# Patient Record
Sex: Male | Born: 1945 | Race: White | Hispanic: No | Marital: Married | Smoking: Never smoker
Health system: Southern US, Community
[De-identification: ages and names within clinical notes are randomized; demographics above are authoritative.]

## PROBLEM LIST (undated history)

## (undated) DIAGNOSIS — I10 Essential (primary) hypertension: Secondary | ICD-10-CM

## (undated) DIAGNOSIS — E7800 Pure hypercholesterolemia, unspecified: Secondary | ICD-10-CM

## (undated) DIAGNOSIS — C61 Malignant neoplasm of prostate: Secondary | ICD-10-CM

## (undated) DIAGNOSIS — I499 Cardiac arrhythmia, unspecified: Secondary | ICD-10-CM

## (undated) DIAGNOSIS — I4891 Unspecified atrial fibrillation: Secondary | ICD-10-CM

## (undated) HISTORY — DX: Cardiac arrhythmia, unspecified: I49.9

## (undated) HISTORY — PX: HERNIA REPAIR: SHX51

## (undated) HISTORY — DX: Malignant neoplasm of prostate: C61

---

## 2008-07-27 ENCOUNTER — Ambulatory Visit (HOSPITAL_COMMUNITY): Admission: RE | Admit: 2008-07-27 | Discharge: 2008-07-27 | Payer: Self-pay | Admitting: Family Medicine

## 2008-08-01 ENCOUNTER — Encounter (HOSPITAL_COMMUNITY): Admission: RE | Admit: 2008-08-01 | Discharge: 2008-08-31 | Payer: Self-pay | Admitting: Family Medicine

## 2008-08-17 ENCOUNTER — Ambulatory Visit (HOSPITAL_COMMUNITY): Admission: RE | Admit: 2008-08-17 | Discharge: 2008-08-17 | Payer: Self-pay | Admitting: Family Medicine

## 2009-05-22 ENCOUNTER — Ambulatory Visit (HOSPITAL_COMMUNITY): Admission: RE | Admit: 2009-05-22 | Discharge: 2009-05-22 | Payer: Self-pay | Admitting: General Surgery

## 2009-06-25 ENCOUNTER — Ambulatory Visit (HOSPITAL_COMMUNITY): Admission: RE | Admit: 2009-06-25 | Discharge: 2009-06-25 | Payer: Self-pay | Admitting: General Surgery

## 2010-05-01 IMAGING — US US ABDOMEN COMPLETE
1 series · 14 of 25 positions shown · non-contrast
Comparison: None

CLINICAL DATA: Right upper quadrant pain

COMPLETE ABDOMINAL ULTRASOUND

[Series 1: unknown · 0.33mm/px · 14 of 72 slices shown]
[im 1/72]
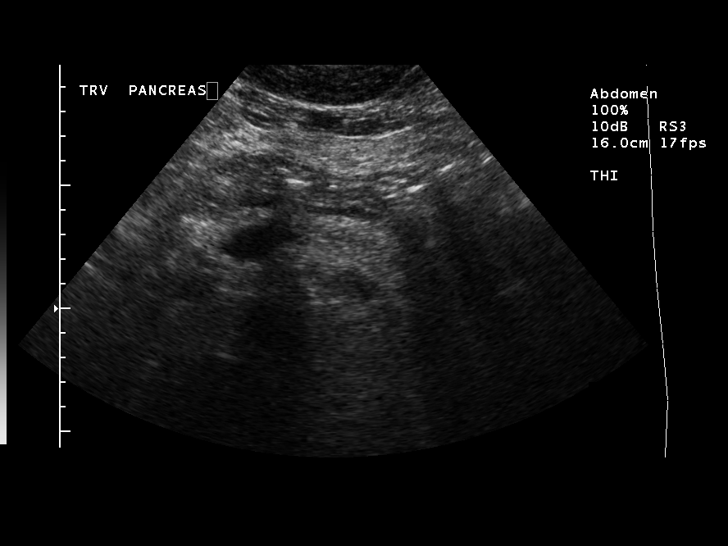
[im 6/72]
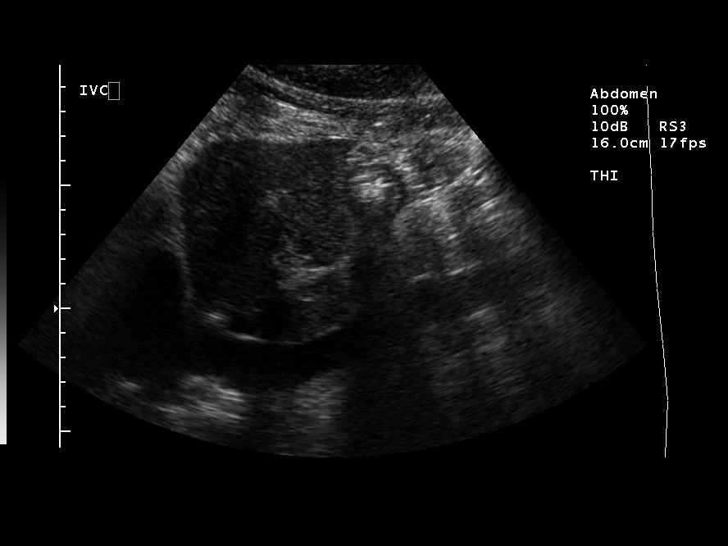
[im 12/72]
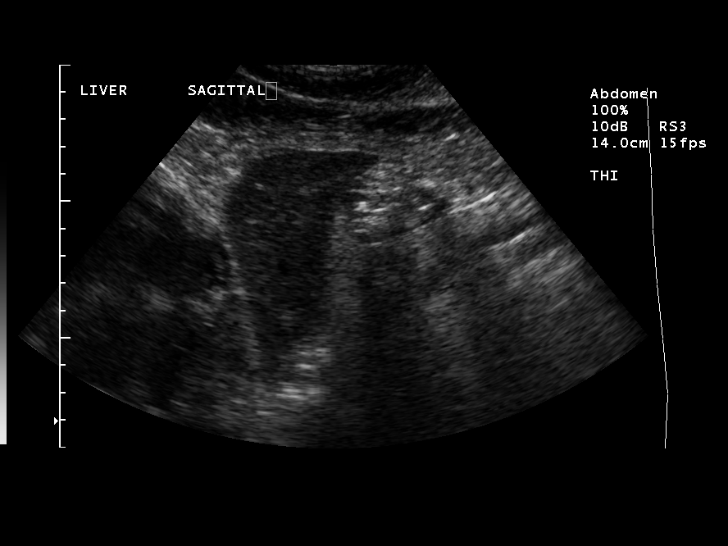
[im 18/72]
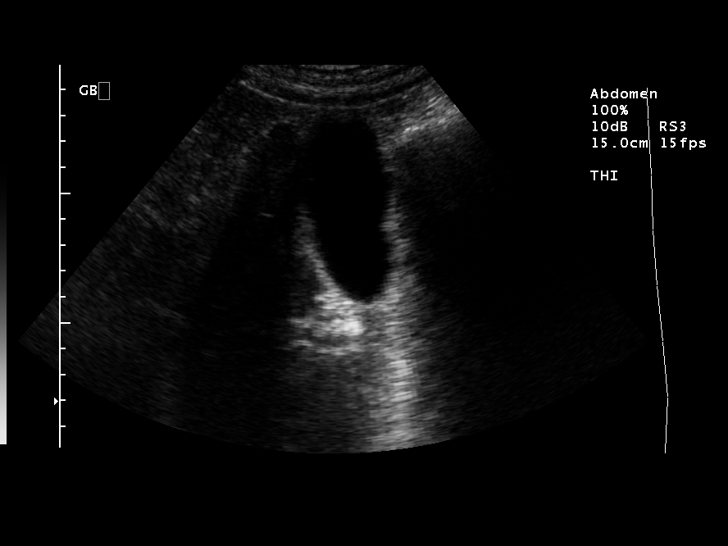
[im 24/72]
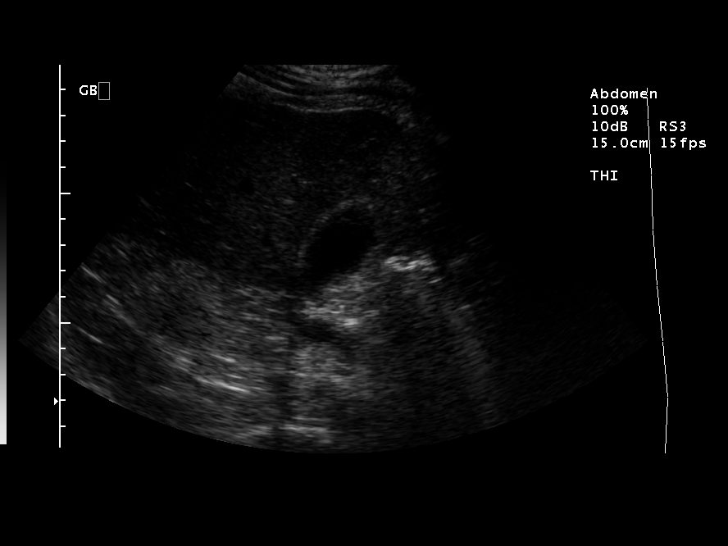
[im 27/72]
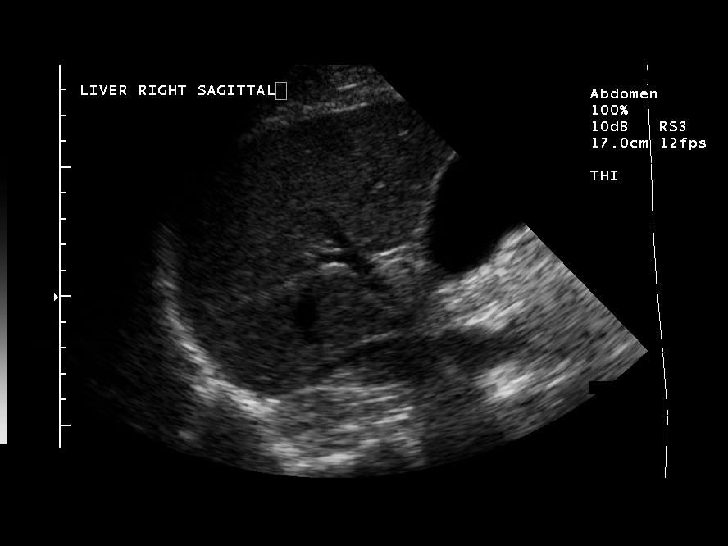
[im 33/72]
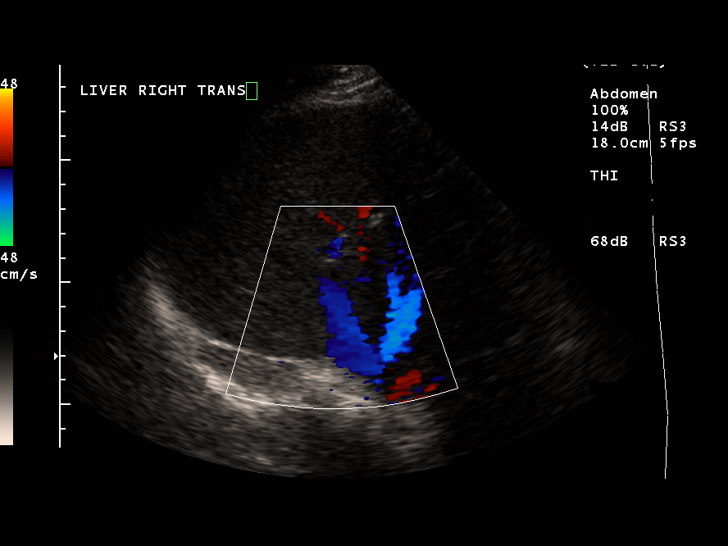
[im 39/72]
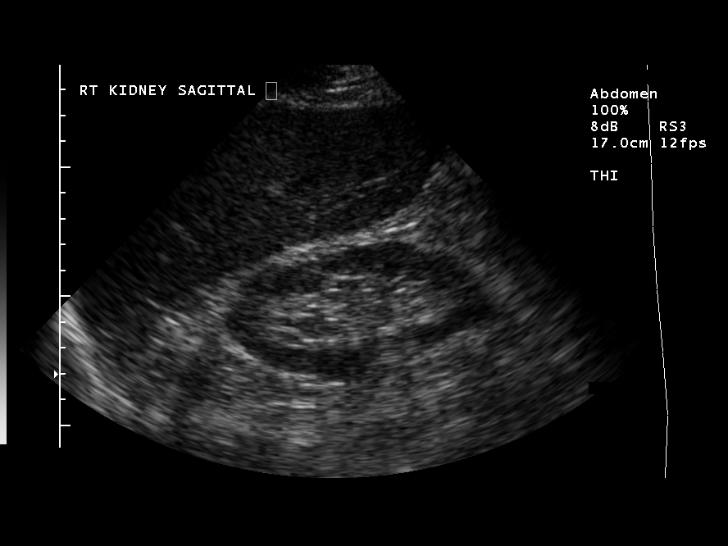
[im 45/72]
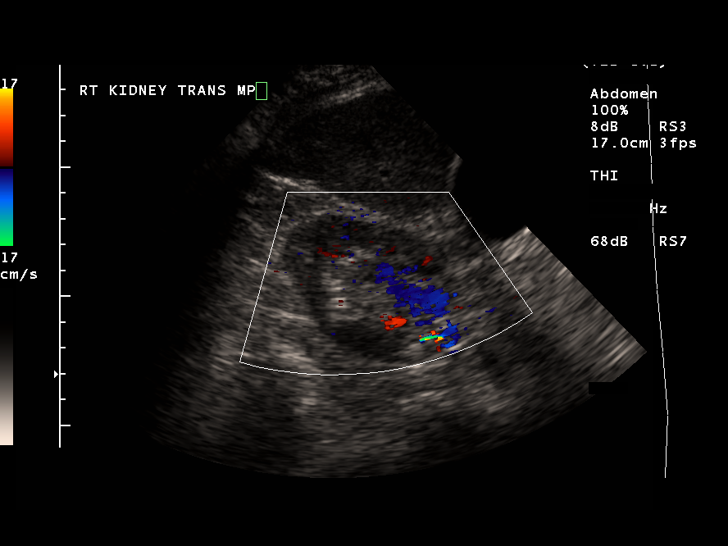
[im 48/72]
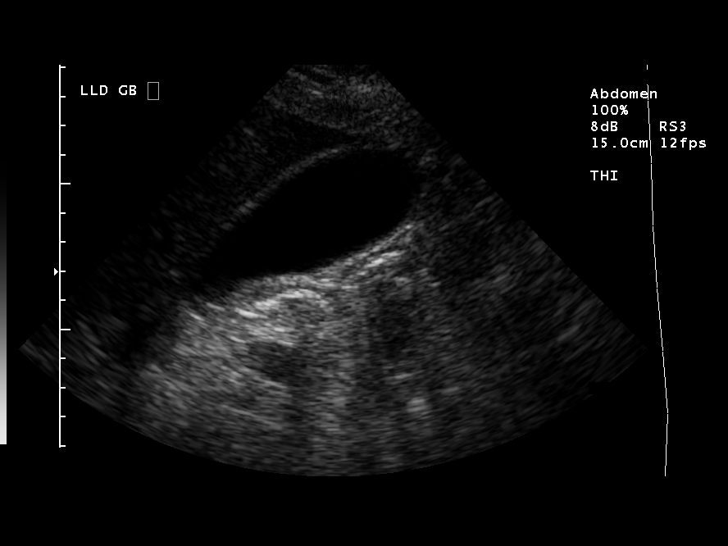
[im 54/72]
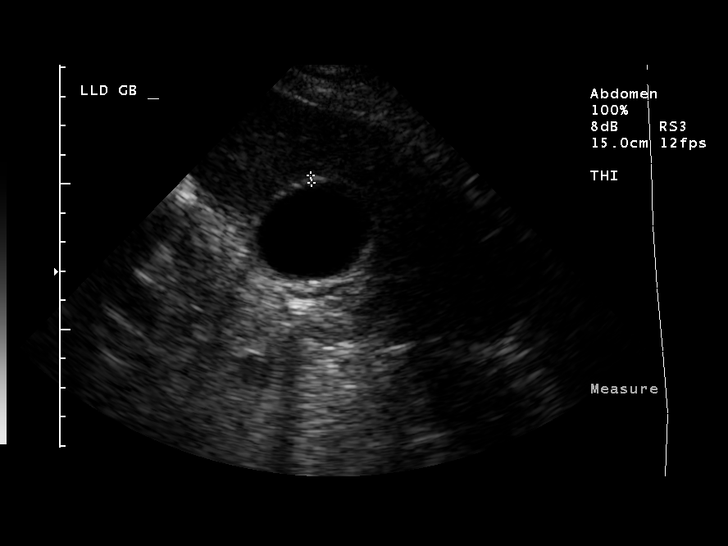
[im 60/72]
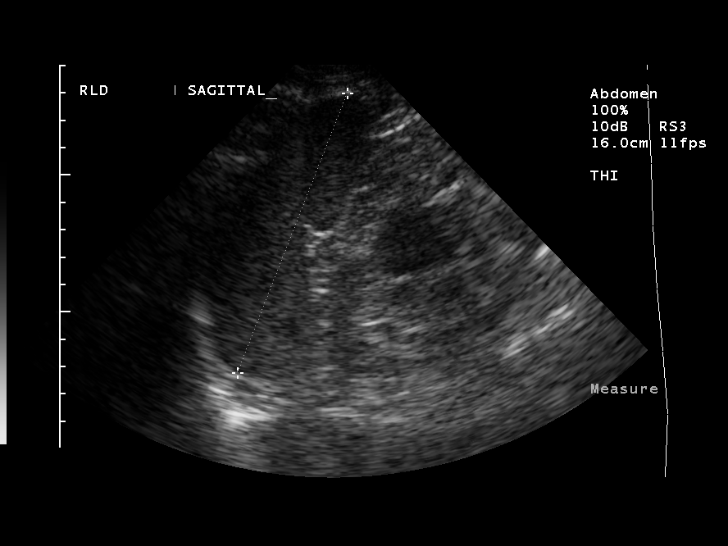
[im 66/72]
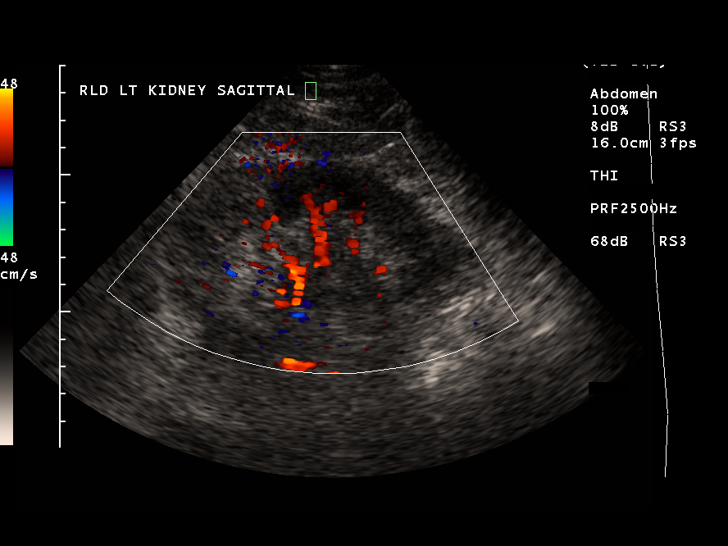
[im 72/72]
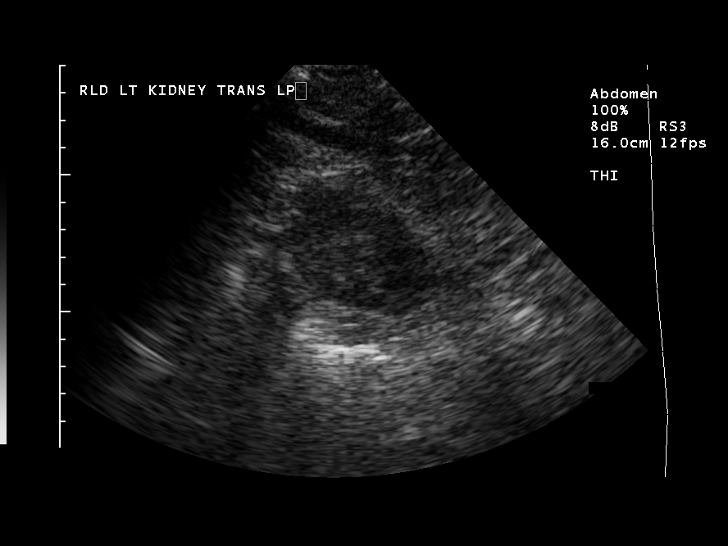

[14 of 25 positions shown; findings below may reference images not displayed]

FINDINGS: Gallbladder:  Normally distended without stones or wall thickening.
No sonographic Murphy's sign.

Common bile duct:  4.4 mm diameter, normal.

Liver:  Normal appearance.

IVC:  Unremarkable.

Pancreas:  Normal appearance.

Spleen:  Normal appearance, 10.9 cm length.

Right Kidney:  Normal size and morphology, 10.4 cm length.

Left Kidney:  Normal size and morphology, 11.4 cm length.

Abdominal aorta:  Normal caliber, unremarkable.

No free fluid.
IMPRESSION: Normal upper abdominal ultrasound.

## 2010-05-06 IMAGING — NM NM HEPATO W/GB/PHARM/[PERSON_NAME]
2 series · 12 of 12 positions shown · non-contrast
Comparison: Abdominal ultrasound 07/27/2008

CLINICAL DATA: Abdominal pain.

NUCLEAR MEDICINE HEPATOBILIARY IMAGING WITH GALLBLADDER EF
TECHNIQUE: Sequential images of the abdomen were obtained [DATE]
minutes following intravenous administration of
radiopharmaceutical.  After the slow intravenous infusion of
uCg Cholecystokinin, the gallbladder ejection fraction was
determined.
Radiopharmaceutical:  4.9 mCi 7c-FFm Choletec

[Series 1: hida · 3.20mm/px · 6 of 30 frames shown (1 of 2)]
[frame 3/30]
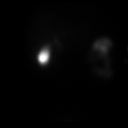
[frame 8/30]
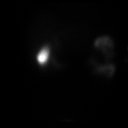
[frame 13/30]
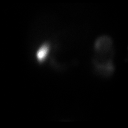
[frame 18/30]
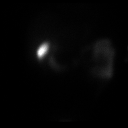
[frame 23/30]
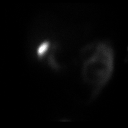
[frame 28/30]
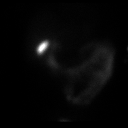

[Series 1: hida · 3.20mm/px · 6 of 60 frames shown (2 of 2)]
[frame 6/60]
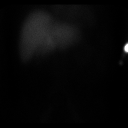
[frame 16/60]
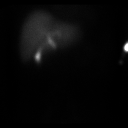
[frame 26/60]
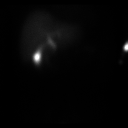
[frame 36/60]
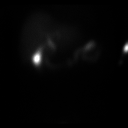
[frame 46/60]
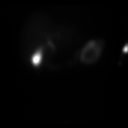
[frame 56/60]
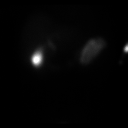

[12 of 12 positions shown; findings below may reference images not displayed]

FINDINGS: Initial images demonstrate homogenous hepatic activity
and prompt opacification of the gallbladder and biliary system.
There is spontaneous small bowel activity.

The stimulated portion of the study demonstrates good gallbladder
contraction and progressive small bowel activity.  The gallbladder
ejection fraction calculated at approximately 30 minutes is 51.2%.
Normal ejection fractions are considered greater than 35%.
IMPRESSION: Normal examination.  The cystic and common bile ducts are patent
and the gallbladder ejection fraction is normal at 51%.

## 2010-07-15 LAB — CBC
HCT: 51.8 % (ref 39.0–52.0)
Hemoglobin: 17.7 g/dL — ABNORMAL HIGH (ref 13.0–17.0)
WBC: 8.6 10*3/uL (ref 4.0–10.5)

## 2010-07-15 LAB — BASIC METABOLIC PANEL
Chloride: 100 mEq/L (ref 96–112)
GFR calc non Af Amer: 47 mL/min — ABNORMAL LOW (ref >60)
Potassium: 4.7 mEq/L (ref 3.5–5.1)
Sodium: 138 mEq/L (ref 135–145)

## 2012-01-03 ENCOUNTER — Emergency Department (HOSPITAL_COMMUNITY)
Admission: EM | Admit: 2012-01-03 | Discharge: 2012-01-03 | Disposition: A | Discharge status: Home / Self Care | Payer: Medicare Other | Attending: Emergency Medicine | Admitting: Emergency Medicine

## 2012-01-03 ENCOUNTER — Encounter (HOSPITAL_COMMUNITY): Payer: Self-pay | Admitting: Emergency Medicine

## 2012-01-03 DIAGNOSIS — X58XXXA Exposure to other specified factors, initial encounter: Secondary | ICD-10-CM | POA: Insufficient documentation

## 2012-01-03 DIAGNOSIS — I10 Essential (primary) hypertension: Secondary | ICD-10-CM | POA: Insufficient documentation

## 2012-01-03 DIAGNOSIS — T7840XA Allergy, unspecified, initial encounter: Secondary | ICD-10-CM

## 2012-01-03 DIAGNOSIS — T783XXA Angioneurotic edema, initial encounter: Secondary | ICD-10-CM

## 2012-01-03 DIAGNOSIS — I4891 Unspecified atrial fibrillation: Secondary | ICD-10-CM | POA: Insufficient documentation

## 2012-01-03 DIAGNOSIS — Z79899 Other long term (current) drug therapy: Secondary | ICD-10-CM | POA: Insufficient documentation

## 2012-01-03 DIAGNOSIS — Z7982 Long term (current) use of aspirin: Secondary | ICD-10-CM | POA: Insufficient documentation

## 2012-01-03 DIAGNOSIS — E7800 Pure hypercholesterolemia, unspecified: Secondary | ICD-10-CM | POA: Insufficient documentation

## 2012-01-03 HISTORY — DX: Pure hypercholesterolemia, unspecified: E78.00

## 2012-01-03 HISTORY — DX: Unspecified atrial fibrillation: I48.91

## 2012-01-03 HISTORY — DX: Essential (primary) hypertension: I10

## 2012-01-03 MED ORDER — PREDNISONE 20 MG PO TABS: ORAL_TABLET | ORAL | Status: DC

## 2012-01-03 MED ORDER — FAMOTIDINE IN NACL 20-0.9 MG/50ML-% IV SOLN
20.0000 mg | Freq: Once | INTRAVENOUS | Status: AC
  Administered 2012-01-03: 20 mg via INTRAVENOUS
  Filled 2012-01-03: qty 50

## 2012-01-03 MED ORDER — FAMOTIDINE 20 MG PO TABS: 20.0000 mg | ORAL_TABLET | Freq: Two times a day (BID) | ORAL | Status: DC

## 2012-01-03 MED ORDER — METHYLPREDNISOLONE SODIUM SUCC 125 MG IJ SOLR
125.0000 mg | Freq: Once | INTRAMUSCULAR | Status: AC
  Administered 2012-01-03: 125 mg via INTRAVENOUS
  Filled 2012-01-03: qty 2

## 2012-01-03 NOTE — ED Notes (Signed)
Pt reports that he woke up from his nap this afternoon and the left side of his mouth and lower lip was swelling. Upon assessment tongue and lower lip is swollen and enlarged. Pt denies any difficulty swallowing. Pt has a rash on his abdomen. Pt denies any new self care products or medications. Pt took two 25 mg benadryl prior to arrival. Pt airway intact, no active distress noted.

## 2012-01-03 NOTE — ED Notes (Signed)
Pt discharged. Pt stable at time of discharge. Medications reviewed pt has no questions regarding discharge at this time. Pt voiced understanding of discharge instructions.  

## 2012-01-03 NOTE — ED Notes (Signed)
Patient with swelling to right side of mouth and tongue. Denies shortness of breath, able to talk in complete sentences. IV initiated.

## 2012-01-03 NOTE — ED Provider Notes (Signed)
History   This chart was scribed for Angel Givens, MD scribed by Magnus Sinning. The patient was seen in room APA06/APA06 at 18:28  CSN: [id number]  Arrival date & time 01/03/12  1745     Chief Complaint  Patient presents with  . Oral Swelling    (Consider location/radiation/quality/duration/timing/severity/associated sxs/prior treatment) The history is provided by the patient. No language interpreter was used.  Angel Peters is a 66 y.o. male who presents to the Emergency Department complaining of constant moderate left sided oral swelling onset this afternoon approximately 2.5 hours ago. Patient says he woke up from a nap today and noticed his lower lip, and tongue were noticeably swollen. He says his wife gave him benadryl, but explains his tongue seemed to worsen. He states he then began having intermittent pruritis on his legs and arms.Patient denies hx of similar sxs and explains when he arrived the nursing staff noticed a rash on his abd. Pt says he has not started any new medications within the last the last two months, eaten anything new today, or applied any or used any new creams or soaps. Pt's wife does note that they went hiking this morning and he suspect a possible insect bite. Patient has a hx of A-fib, which he treats with a blood thinner.   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  Cardiologist: Dr. Rennis Golden PCP: Dr. Juanetta Gosling   Past Medical History  Diagnosis Date  . Hypertension   . A-fib   . High cholesterol     Past Surgical History  Procedure Date  . Hernia repair     History reviewed. No pertinent family history.  History  Substance Use Topics  . Smoking status: Never Smoker   . Smokeless tobacco: Not on file  . Alcohol Use: Yes     socially  retired  Review of Systems 10 Systems reviewed and are negative for acute change except as noted in the HPI. Allergies  Review of patient's allergies indicates no known allergies.  Home  Medications   Current Outpatient Rx  Name Route Sig Dispense Refill  . AMLODIPINE BESYLATE 10 MG PO TABS Oral Take 10 mg by mouth daily.    . APIXABAN 5 MG PO TABS Oral Take 5 mg by mouth 2 (two) times daily.    . ASPIRIN EC 81 MG PO TBEC Oral Take 81 mg by mouth daily.    Angel Peters HYDRALAZINE HCL 25 MG PO TABS Oral Take 25 mg by mouth 3 (three) times daily.    Angel Peters METOPROLOL SUCCINATE ER 100 MG PO TB24 Oral Take 150 mg by mouth daily. Take with or immediately following a meal.    . ONE-DAILY MULTI VITAMINS PO TABS Oral Take 1 tablet by mouth daily.    Angel Peters ROSUVASTATIN CALCIUM 20 MG PO TABS Oral Take 20 mg by mouth daily.      BP 149/94  Pulse 80  Temp 97.5 F (36.4 C) (Oral)  Resp 18  Ht 5\' 9"  (1.753 m)  Wt 180 lb (81.647 kg)  BMI 26.58 kg/m2  SpO2 97%  Vital signs normal    Physical Exam  Nursing note and vitals reviewed. Constitutional: He is oriented to person, place, and time. He appears well-developed and well-nourished. No distress.  HENT:  Head: Normocephalic and atraumatic.       Tongue is mildly thick and lower lip swelling noted  Eyes: EOM are normal. Pupils are equal, round, and reactive to light.  Neck: Neck supple. No tracheal deviation present.  Cardiovascular:       Irregular rate  Pulmonary/Chest: Effort normal. No respiratory distress.  Abdominal: Soft. He exhibits no distension.  Musculoskeletal: Normal range of motion. He exhibits no edema.  Neurological: He is alert and oriented to person, place, and time. No sensory deficit.  Skin: Skin is warm and dry. Rash noted.       Small scattered red lesions on lower extremities.  Excoriations around umbilicus  Psychiatric: He has a normal mood and affect. His behavior is normal.    ED Course  Procedures (including critical care time) DIAGNOSTIC STUDIES: Oxygen Saturation is 97% on room air, normal by my interpretation.    COORDINATION OF CARE: 18:36: Informed patient to monitor with IV and medications. Patient  and family agreeable.  19:47: Performed recheck. Patient notes improvement of tongue and lips. Provided intent to d/c and recommended continued monitor and benadryl if sxs persists.  Will d/c home with prednisone and pepcid, which he will need to finish.   Medications administered in the ED  Medications  famotidine (PEPCID) IVPB 20 mg (0 mg Intravenous Stopped 01/03/12 1950)  methylPREDNISolone sodium succinate (SOLU-MEDROL) 125 mg/2 mL injection 125 mg (125 mg Intravenous Given 01/03/12 1847)       1. Allergic reaction   2. Angioedema of lips    New Prescriptions   FAMOTIDINE (PEPCID) 20 MG TABLET    Take 1 tablet (20 mg total) by mouth 2 (two) times daily.   PREDNISONE (DELTASONE) 20 MG TABLET    Take 3 po QD x 2d starting tomorrow, then 2 po QD x 3d then 1 po QD x 3d   Plan discharge  Angel Albe, MD, FACEP    MDM   I personally performed the services described in this documentation, which was scribed in my presence. The recorded information has been reviewed and considered. Angel Albe, MD, Armando Gang        Athen Givens, MD 01/12/12 1249

## 2012-08-26 ENCOUNTER — Encounter: Payer: Self-pay | Admitting: Internal Medicine

## 2012-09-02 ENCOUNTER — Other Ambulatory Visit: Payer: Self-pay | Admitting: *Deleted

## 2012-09-02 MED ORDER — ROSUVASTATIN CALCIUM 20 MG PO TABS: 20.0000 mg | ORAL_TABLET | Freq: Every day | ORAL | Status: DC

## 2012-09-03 ENCOUNTER — Encounter: Payer: Self-pay | Admitting: Internal Medicine

## 2012-09-03 ENCOUNTER — Ambulatory Visit (INDEPENDENT_AMBULATORY_CARE_PROVIDER_SITE_OTHER): Payer: Medicare Other | Admitting: Internal Medicine

## 2012-09-03 VITALS — BP 110/70 | HR 70 | Ht 69.0 in | Wt 196.0 lb

## 2012-09-03 DIAGNOSIS — I4891 Unspecified atrial fibrillation: Secondary | ICD-10-CM

## 2012-09-03 DIAGNOSIS — I10 Essential (primary) hypertension: Secondary | ICD-10-CM

## 2012-09-03 DIAGNOSIS — E785 Hyperlipidemia, unspecified: Secondary | ICD-10-CM

## 2012-09-03 DIAGNOSIS — E782 Mixed hyperlipidemia: Secondary | ICD-10-CM

## 2012-09-03 DIAGNOSIS — I482 Chronic atrial fibrillation, unspecified: Secondary | ICD-10-CM

## 2012-09-03 NOTE — Progress Notes (Signed)
[facility]          OFFICE NOTE   Chief Complaint:  Annual visit  Primary Care Physician: Angel Maudlin, MD  HPI:  Angel Peters is a 67 year old gentleman with permanent atrial fibrillation, moderate MR, mildly reduced LV function and history of hypertension and dyslipidemia. He is actually doing fairly well during his retirement. He has managed to lose some more weight. Blood pressure has been well controlled and he exercises fairly regularly. He recently had abnormal cholesterol profile with an LDL of 150 and I recommended increasing his simvastatin to Crestor 20 mg daily. He has tolerated that well with a marked improvement of his total cholesterol. Of course, repeat cholesterol testing in your office now shows a total cholesterol of 130 with LDL of 73, which is good overall control. In addition, he does have chronic kidney disease with a creatinine of 1.8 in March; however, improved to 1.4 in May. Overall, he has no significant new symptoms such as worsening chest pain, shortness of breath, palpitations, presyncope or syncopal symptoms. He continues in chronic atrial fibrillation on Eliquis without obvious bleeding events.  He also takes low dose aspirin.  PMHx:  Past Medical History  Diagnosis Date  . Hypertension   . A-fib     Moderate MR  2D Echo showed EF of 45-50% on 05/08/2009  . High cholesterol     Past Surgical History  Procedure Laterality Date  . Hernia repair      FAMHx:  Family History  Problem Relation Age of Onset  . Heart disease      SOCHx:   reports that he has never smoked. He does not have any smokeless tobacco history on file. He reports that  drinks alcohol. He reports that he does not use illicit drugs.  ALLERGIES:  No Known Allergies  ROS: A comprehensive review of systems was negative except for: Cardiovascular: positive for irregular heart beat  HOME MEDS: Current Outpatient Prescriptions  Medication Sig  Dispense Refill  . amLODipine (NORVASC) 10 MG tablet Take 10 mg by mouth daily.      Marland Kitchen apixaban (ELIQUIS) 5 MG TABS tablet Take 5 mg by mouth 2 (two) times daily.      Marland Kitchen aspirin EC 81 MG tablet Take 81 mg by mouth daily.      . hydrALAZINE (APRESOLINE) 25 MG tablet Take 25 mg by mouth 3 (three) times daily.      . metoprolol succinate (TOPROL-XL) 100 MG 24 hr tablet Take 150 mg by mouth daily. Take with or immediately following a meal.      . Multiple Vitamin (MULTIVITAMIN) tablet Take 1 tablet by mouth daily.      . rosuvastatin (CRESTOR) 20 MG tablet Take 1 tablet (20 mg total) by mouth daily.  90 tablet  3  . famotidine (PEPCID) 20 MG tablet Take 1 tablet (20 mg total) by mouth 2 (two) times daily.  18 tablet  0  . predniSONE (DELTASONE) 20 MG tablet Take 3 po QD x 2d starting tomorrow, then 2 po QD x 3d then 1 po QD x 3d  15 tablet  0   No current facility-administered medications for this visit.    LABS/IMAGING: No results found for this or any previous visit (from the past 48 hour(s)). No results found.  VITALS: BP 110/70  Pulse 70  Ht 5\' 9"  (1.753 m)  Wt 196 lb (88.905 kg)  BMI 28.93 kg/m2  EXAM: General appearance: alert and  no distress Neck: no adenopathy, no carotid bruit, no JVD, supple, symmetrical, trachea midline and thyroid not enlarged, symmetric, no tenderness/mass/nodules Lungs: clear to auscultation bilaterally Heart: irregularly irregular rhythm Abdomen: soft, non-tender; bowel sounds normal; no masses,  no organomegaly Extremities: extremities normal, atraumatic, no cyanosis or edema Pulses: 2+ and symmetric Skin: Skin color, texture, turgor normal. No rashes or lesions Neurologic: Grossly normal  EKG: Atrial fibrillation at 70  ASSESSMENT: 1. Chronic atrial fibrillation 2. Hypertension - at goal 3. Hyperlipidemia 4. CKD II  PLAN: 1.   Angel Peters is doing well. He has had no adverse bleeding with Eliquis. He continues in rate controlled a-fib which  is chronic, if not permanent. He is unaware of it. A recent renal panel showed a creatinine of 1.52, which is about baseline. He is in need of a new lipid profile, as it has been about 1 year since it was last checked. We'll do that today and follow-up with him annually or sooner if needed.  Angel Nose, MD, [facility] Attending Cardiologist [facility]  Angel Peters 09/03/2012, 6:38 PM

## 2012-09-03 NOTE — Patient Instructions (Addendum)
Your physician recommends that you schedule a follow-up appointment in 12 months May 2015.  Your physician recommends that you continue on your current medications as directed. Please refer to the Current Medication list given to you today.   Your physician recommends that you return for a FASTING lipid profile

## 2012-09-06 LAB — LIPID PANEL
HDL: 44 mg/dL (ref >39)
LDL Cholesterol: 99 mg/dL (ref 0–99)
Total CHOL/HDL Ratio: 3.8 Ratio
VLDL: 24 mg/dL (ref 0–40)

## 2012-09-15 ENCOUNTER — Telehealth: Payer: Self-pay | Admitting: *Deleted

## 2012-09-15 NOTE — Telephone Encounter (Signed)
LM w/lab results, continue current meds

## 2012-10-01 ENCOUNTER — Other Ambulatory Visit: Payer: Self-pay | Admitting: *Deleted

## 2012-10-01 MED ORDER — APIXABAN 5 MG PO TABS: 5.0000 mg | ORAL_TABLET | Freq: Two times a day (BID) | ORAL | Status: DC

## 2013-02-07 ENCOUNTER — Other Ambulatory Visit: Payer: Self-pay | Admitting: *Deleted

## 2013-02-07 MED ORDER — METOPROLOL SUCCINATE ER 100 MG PO TB24: ORAL_TABLET | ORAL | Status: DC

## 2013-08-01 ENCOUNTER — Other Ambulatory Visit: Payer: Self-pay

## 2013-08-01 MED ORDER — METOPROLOL SUCCINATE ER 100 MG PO TB24: ORAL_TABLET | ORAL | Status: DC

## 2013-08-01 MED ORDER — APIXABAN 5 MG PO TABS: 5.0000 mg | ORAL_TABLET | Freq: Two times a day (BID) | ORAL | Status: DC

## 2013-08-01 MED ORDER — ROSUVASTATIN CALCIUM 20 MG PO TABS: 20.0000 mg | ORAL_TABLET | Freq: Every day | ORAL | Status: DC

## 2013-08-01 NOTE — Telephone Encounter (Signed)
Rx was sent to pharmacy electronically. 

## 2013-08-22 ENCOUNTER — Other Ambulatory Visit: Payer: Self-pay | Admitting: *Deleted

## 2013-08-22 MED ORDER — ROSUVASTATIN CALCIUM 20 MG PO TABS: 20.0000 mg | ORAL_TABLET | Freq: Every day | ORAL | Status: DC

## 2013-09-05 ENCOUNTER — Encounter: Payer: Self-pay | Admitting: Internal Medicine

## 2013-09-05 ENCOUNTER — Ambulatory Visit (INDEPENDENT_AMBULATORY_CARE_PROVIDER_SITE_OTHER): Payer: Medicare Other | Admitting: Internal Medicine

## 2013-09-05 VITALS — BP 142/84 | HR 81 | Ht 69.0 in | Wt 194.3 lb

## 2013-09-05 DIAGNOSIS — R600 Localized edema: Secondary | ICD-10-CM

## 2013-09-05 DIAGNOSIS — R609 Edema, unspecified: Secondary | ICD-10-CM

## 2013-09-05 DIAGNOSIS — I482 Chronic atrial fibrillation, unspecified: Secondary | ICD-10-CM

## 2013-09-05 DIAGNOSIS — I4891 Unspecified atrial fibrillation: Secondary | ICD-10-CM

## 2013-09-05 DIAGNOSIS — E785 Hyperlipidemia, unspecified: Secondary | ICD-10-CM

## 2013-09-05 DIAGNOSIS — I10 Essential (primary) hypertension: Secondary | ICD-10-CM

## 2013-09-05 DIAGNOSIS — I340 Nonrheumatic mitral (valve) insufficiency: Secondary | ICD-10-CM

## 2013-09-05 DIAGNOSIS — I428 Other cardiomyopathies: Secondary | ICD-10-CM

## 2013-09-05 DIAGNOSIS — I059 Rheumatic mitral valve disease, unspecified: Secondary | ICD-10-CM

## 2013-09-05 NOTE — Progress Notes (Signed)
Chief Complaint:  Annual visit  Primary Care Physician: Fredirick Maudlin, MD  HPI:  Angel Peters is a 68 year old gentleman with permanent atrial fibrillation, moderate MR, mildly reduced LV function and history of hypertension and dyslipidemia. He is actually doing fairly well during his retirement. He has managed to lose some more weight. Blood pressure has been well controlled and he exercises fairly regularly. He recently had abnormal cholesterol profile with an LDL of 150 and I recommended increasing his simvastatin to Crestor 20 mg daily. He has tolerated that well with a marked improvement of his total cholesterol. Of course, repeat cholesterol testing in your office now shows a total cholesterol of 130 with LDL of 73, which is good overall control. In addition, he does have chronic kidney disease with a creatinine of 1.8 in March; however, improved to 1.4 in May. Overall, he has no significant new symptoms such as worsening chest pain, shortness of breath, palpitations, presyncope or syncopal symptoms. He continues in chronic atrial fibrillation on Eliquis without obvious bleeding events.  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 This has not been reassessed since that time. It is notable that he has 1+ bilateral lower extremity swelling but denies any shortness of breath. He has a known mitral murmur.  PMHx:  Past Medical History  Diagnosis Date  . Hypertension   . A-fib     Moderate MR  2D Echo showed EF of 45-50% on 05/08/2009  . High cholesterol     Past Surgical History  Procedure Laterality Date  . Hernia repair      FAMHx:  Family History  Problem Relation Age of Onset  . Heart disease      SOCHx:   reports that he has never smoked. He does not have any smokeless tobacco history on file. He reports that he drinks alcohol. He reports that he does not use illicit drugs.  ALLERGIES:  No Known  Allergies  ROS: A comprehensive review of systems was negative except for: Cardiovascular: positive for irregular heart beat and lower extremity edema  HOME MEDS: Current Outpatient Prescriptions  Medication Sig Dispense Refill  . amLODipine (NORVASC) 10 MG tablet Take 10 mg by mouth daily.      Marland Kitchen apixaban (ELIQUIS) 5 MG TABS tablet Take 1 tablet (5 mg total) by mouth 2 (two) times daily.  180 tablet  0  . hydrALAZINE (APRESOLINE) 25 MG tablet Take 25 mg by mouth 3 (three) times daily.      . metoprolol succinate (TOPROL-XL) 100 MG 24 hr tablet Take 1.5 tablets (150 mg total) by mouth once daily. Take with or immediately following a meal.  135 tablet  0  . Multiple Vitamin (MULTIVITAMIN) tablet Take 1 tablet by mouth daily.      . naproxen sodium (ANAPROX) 220 MG tablet Take 220 mg by mouth daily.      . rosuvastatin (CRESTOR) 20 MG tablet Take 1 tablet (20 mg total) by mouth daily.  90 tablet  1   No current facility-administered medications for this visit.    LABS/IMAGING: No results found for this or any previous visit (from the past 48 hour(s)). No results found.  VITALS: BP 142/84  Pulse 81  Ht 5\' 9"  (1.753 m)  Wt 194 lb 4.8 oz (88.134 kg)  BMI 28.68 kg/m2  EXAM: General appearance: alert and no distress Neck: no adenopathy, no carotid bruit, no JVD, supple, symmetrical, trachea  midline and thyroid not enlarged, symmetric, no tenderness/mass/nodules Lungs: clear to auscultation bilaterally Heart: irregularly irregular rhythm Abdomen: soft, non-tender; bowel sounds normal; no masses,  no organomegaly Extremities: edema 1+ bilateral LE pitting edema Pulses: 2+ and symmetric Skin: Skin color, texture, turgor normal. No rashes or lesions Neurologic: Grossly normal  EKG: Atrial fibrillation at 81  ASSESSMENT: 1. Chronic atrial fibrillation 2. Hypertension - at goal 3. 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 Moderate MR, mild TR,  mild AI  PLAN: 1.   Angel Peters is doing well. He has had no adverse bleeding with Eliquis. He continues in rate controlled a-fib which is chronic, if not permanent. He is unaware of it. A recent renal panel showed a creatinine of 1.52, which is about baseline. He does not get short of breath with activities. He does have significant LE edema - which may be due to norvasc, however, I would like to re-assess his LV function. Recommend a repeat echocardiogram.  He will get labs through his PCP next week. Plan follow-up annually.  Chrystie NoseKenneth C. Zailyn Rowser, MD, [facility] Attending Cardiologist [facility]  Chrystie NoseKenneth C. Anaeli Cornwall 09/05/2013, 9:17 AM

## 2013-09-05 NOTE — Patient Instructions (Addendum)
Please have your primary doctor sent your lab work to Dr. Blanchie Dessert office (fax [phone]). Thank you.   Your physician has requested that you have an echocardiogram. Echocardiography is a painless test that uses sound waves to create images of your heart. It provides your doctor with information about the size and shape of your heart and how well your heart's chambers and valves are working. This procedure takes approximately one hour. There are no restrictions for this procedure.  Your physician wants you to follow-up in: 1 year. You will receive a reminder letter in the mail two months in advance. If you don't receive a letter, please call our office to schedule the follow-up appointment.

## 2013-09-06 ENCOUNTER — Ambulatory Visit (HOSPITAL_COMMUNITY)
Admission: RE | Admit: 2013-09-06 | Discharge: 2013-09-06 | Disposition: A | Discharge status: Home / Self Care | Payer: Medicare Other | Source: Ambulatory Visit | Attending: Internal Medicine | Admitting: Internal Medicine

## 2013-09-06 DIAGNOSIS — I059 Rheumatic mitral valve disease, unspecified: Secondary | ICD-10-CM | POA: Insufficient documentation

## 2013-09-06 DIAGNOSIS — I10 Essential (primary) hypertension: Secondary | ICD-10-CM | POA: Insufficient documentation

## 2013-09-06 DIAGNOSIS — I340 Nonrheumatic mitral (valve) insufficiency: Secondary | ICD-10-CM

## 2013-09-06 DIAGNOSIS — R600 Localized edema: Secondary | ICD-10-CM

## 2013-09-06 DIAGNOSIS — I369 Nonrheumatic tricuspid valve disorder, unspecified: Secondary | ICD-10-CM

## 2013-09-06 DIAGNOSIS — I482 Chronic atrial fibrillation, unspecified: Secondary | ICD-10-CM

## 2013-09-06 DIAGNOSIS — I4891 Unspecified atrial fibrillation: Secondary | ICD-10-CM

## 2013-09-06 DIAGNOSIS — E785 Hyperlipidemia, unspecified: Secondary | ICD-10-CM | POA: Insufficient documentation

## 2013-09-06 DIAGNOSIS — I428 Other cardiomyopathies: Secondary | ICD-10-CM

## 2013-09-06 NOTE — Progress Notes (Signed)
  Echocardiogram 2D Echocardiogram has been performed.  Zalea Pete L Sonya Gunnoe 09/06/2013, 11:03 AM

## 2013-09-26 ENCOUNTER — Other Ambulatory Visit: Payer: Self-pay | Admitting: *Deleted

## 2013-09-26 MED ORDER — METOPROLOL SUCCINATE ER 100 MG PO TB24: ORAL_TABLET | ORAL | Status: DC

## 2013-09-26 NOTE — Telephone Encounter (Signed)
Rx was sent to pharmacy electronically. 

## 2013-12-23 ENCOUNTER — Other Ambulatory Visit: Payer: Self-pay | Admitting: Pharmacist Clinician (PhC)/ Clinical Pharmacy Specialist

## 2013-12-23 MED ORDER — APIXABAN 5 MG PO TABS: 5.0000 mg | ORAL_TABLET | Freq: Two times a day (BID) | ORAL | Status: DC

## 2014-02-14 ENCOUNTER — Other Ambulatory Visit: Payer: Self-pay

## 2014-02-14 ENCOUNTER — Other Ambulatory Visit: Payer: Self-pay | Admitting: *Deleted

## 2014-02-14 MED ORDER — ROSUVASTATIN CALCIUM 20 MG PO TABS: 20.0000 mg | ORAL_TABLET | Freq: Every day | ORAL | Status: DC

## 2014-02-14 NOTE — Telephone Encounter (Signed)
Refilled electronically 

## 2014-02-14 NOTE — Telephone Encounter (Signed)
Rx was sent to pharmacy electronically. 

## 2014-05-24 ENCOUNTER — Other Ambulatory Visit: Payer: Self-pay

## 2014-05-24 MED ORDER — METOPROLOL SUCCINATE ER 100 MG PO TB24: ORAL_TABLET | ORAL | Status: DC

## 2014-05-24 NOTE — Telephone Encounter (Signed)
Rx sent to pharmacy   

## 2014-06-26 ENCOUNTER — Other Ambulatory Visit: Payer: Self-pay | Admitting: *Deleted

## 2014-06-26 MED ORDER — APIXABAN 5 MG PO TABS: 5.0000 mg | ORAL_TABLET | Freq: Two times a day (BID) | ORAL | Status: DC

## 2014-09-27 ENCOUNTER — Other Ambulatory Visit: Payer: Self-pay

## 2014-09-27 NOTE — Telephone Encounter (Signed)
Rx(s) sent to pharmacy electronically.  

## 2014-09-28 MED ORDER — APIXABAN 5 MG PO TABS: ORAL_TABLET | ORAL | Status: AC

## 2014-09-29 ENCOUNTER — Telehealth: Payer: Self-pay | Admitting: Internal Medicine

## 2014-09-29 NOTE — Telephone Encounter (Signed)
Advised [facility] to fill Eliquis for patient.

## 2014-10-16 ENCOUNTER — Other Ambulatory Visit: Payer: Self-pay

## 2021-04-21 HISTORY — PX: CATARACT EXTRACTION: SUR2

## 2022-07-07 ENCOUNTER — Ambulatory Visit (INDEPENDENT_AMBULATORY_CARE_PROVIDER_SITE_OTHER): Payer: Medicare HMO | Admitting: Internal Medicine

## 2022-07-07 ENCOUNTER — Encounter: Payer: Self-pay | Admitting: Internal Medicine

## 2022-07-07 VITALS — BP 137/82 | HR 67 | Resp 16 | Ht 69.0 in | Wt 152.0 lb

## 2022-07-07 DIAGNOSIS — I4811 Longstanding persistent atrial fibrillation: Secondary | ICD-10-CM

## 2022-07-07 DIAGNOSIS — I10 Essential (primary) hypertension: Secondary | ICD-10-CM

## 2022-07-07 DIAGNOSIS — E538 Deficiency of other specified B group vitamins: Secondary | ICD-10-CM | POA: Diagnosis not present

## 2022-07-07 DIAGNOSIS — R897 Abnormal histological findings in specimens from other organs, systems and tissues: Secondary | ICD-10-CM | POA: Insufficient documentation

## 2022-07-07 DIAGNOSIS — E785 Hyperlipidemia, unspecified: Secondary | ICD-10-CM | POA: Diagnosis not present

## 2022-07-07 DIAGNOSIS — I482 Chronic atrial fibrillation, unspecified: Secondary | ICD-10-CM

## 2022-07-07 NOTE — Assessment & Plan Note (Signed)
BP 137/82 today.  Currently on amlodipine 5 mg, hydralazine 75 mg 3 times daily, Toprol 100 mg daily.  Previously on amlodipine 10 mg and had swelling in his legs.  Tolerates taking hydralazine 3 times daily.  Discussed other options for once a day blood pressure medications with patient , but he has been controlled on this regimen for some time. No changes made today.

## 2022-07-07 NOTE — Assessment & Plan Note (Signed)
Currently in Atrial fibrillation, rate controlled. Continue metoprolol succinate 100 mg daily.  Continue Eliquis 5 mg daily. Referral placed for cardiology for patient to establish care as requested.

## 2022-07-07 NOTE — Assessment & Plan Note (Addendum)
History of hyperlipidemia on rosuvastatin 20 mg.  Patient will follow-up for fasting labs, check lipid panel

## 2022-07-07 NOTE — Assessment & Plan Note (Signed)
On B12 supplement for history of B12 deficiency Check B12

## 2022-07-07 NOTE — Assessment & Plan Note (Signed)
History of abnormal Prostate MRI and had prostate biopsy 2 years ago.  Reports low risk biopsy results.  He was following with Dr. Wandra Mannan at [facility] urology in [place].  They have been following his PSA.  Check PSA today and obtain records.  Referral placed to urology

## 2022-07-07 NOTE — Progress Notes (Signed)
HPI:Angel Peters is a 77 y.o. male with history of HTN, HLD, abnormal prostate biopsy, and persistent atrial fibrillation on Eliquis who presents to establish care.  He is retired and moving back to [place] after living in [place] for the past 10 years.  Patient had a prostate biopsy 2 years ago after abnormal MRI.  He reports low risk findings and has been following with a urologist every 6 months.  They are checking his PSA at these visits.  Has good urine flow while taking Flomax.  He would like to be established with a urologist in our area.  He has a history of atrial fibrillation for many years and has been on Eliquis.  No bruising or bleeding while on Eliquis.  No history of failed cardiac ablations. He would like to establish with cardiology.   Past Medical History:  Diagnosis Date   A-fib ([place])    Moderate MR  2D Echo showed EF of 45-50% on 05/08/2009   High cholesterol    Hypertension    Prostate cancer [facility])     Past Surgical History:  Procedure Laterality Date   CATARACT EXTRACTION Bilateral 2023   HERNIA REPAIR      Family History  Problem Relation Age of Onset   Heart disease Father        died at age 67   Heart disease Other     Social History   Tobacco Use   Smoking status: Never  Substance Use Topics   Alcohol use: Yes    Comment: socially   Drug use: No     Physical Exam: Vitals:   07/07/22 1524  BP: 137/82  Pulse: 67  Resp: 16  SpO2: 97%  Weight: 152 lb (68.9 kg)  Height: 5\' 9"  (1.753 m)     Physical Exam Constitutional:      Appearance: He is well-developed, well-groomed and normal weight. He is not ill-appearing.  Eyes:     General: No scleral icterus.    Conjunctiva/sclera: Conjunctivae normal.  Cardiovascular:     Rate and Rhythm: Normal rate. Rhythm irregularly irregular.     Heart sounds: No murmur heard. Pulmonary:     Effort: Pulmonary effort is normal.     Breath sounds: No wheezing, rhonchi or rales.   Musculoskeletal:     Right lower leg: No edema.     Left lower leg: No edema.  Skin:    General: Skin is warm and dry.      Assessment & Plan:   B12 deficiency On B12 supplement for history of B12 deficiency Check B12  Hyperlipidemia History of hyperlipidemia on rosuvastatin 20 mg.  Patient will follow-up for fasting labs, check lipid panel  Essential hypertension BP 137/82 today.  Currently on amlodipine 5 mg, hydralazine 75 mg 3 times daily, Toprol 100 mg daily.  Previously on amlodipine 10 mg and had swelling in his legs.  Tolerates taking hydralazine 3 times daily.  Discussed other options for once a day blood pressure medications with patient , but he has been controlled on this regimen for some time. No changes made today.     Chronic a-fib Currently in Atrial fibrillation, rate controlled. Continue metoprolol succinate 100 mg daily.  Continue Eliquis 5 mg daily. Referral placed for cardiology for patient to establish care as requested.  Abnormal prostate biopsy History of abnormal Prostate MRI and had prostate biopsy 2 years ago.  Reports low risk biopsy results.  He was following with Dr. Wandra Peters at  [facility] urology in [place].  They have been following his PSA.  Check PSA today and obtain records.  Referral placed to urology    Angel Dy, MD

## 2022-07-07 NOTE — Patient Instructions (Addendum)
Thank you, [name] for allowing [place] to provide your care today.   I have ordered the following labs for you:   Lab Orders         PSA         Lipid panel         CMP14+EGFR         CBC with Differential/Platelet         TSH         B12      Referrals ordered today:    Referral Orders         Ambulatory referral to Cardiology         Ambulatory referral to Urology       Reminders: Come back at your convenience in the next 2 weeks to have your lab work completed.     Tamsen Snider, M.D.

## 2022-07-24 ENCOUNTER — Other Ambulatory Visit: Payer: Self-pay | Admitting: Internal Medicine

## 2022-07-24 DIAGNOSIS — I10 Essential (primary) hypertension: Secondary | ICD-10-CM

## 2022-07-24 LAB — CBC WITH DIFFERENTIAL/PLATELET
Basophils Absolute: 0 10*3/uL (ref 0.0–0.2)
Basos: 1 %
EOS (ABSOLUTE): 0.1 10*3/uL (ref 0.0–0.4)
Eos: 1 %
Hematocrit: 48.8 % (ref 37.5–51.0)
Hemoglobin: 15.5 g/dL (ref 13.0–17.7)
Immature Grans (Abs): 0 10*3/uL (ref 0.0–0.1)
Immature Granulocytes: 0 %
Lymphocytes Absolute: 1.3 10*3/uL (ref 0.7–3.1)
Lymphs: 26 %
MCH: 30.2 pg (ref 26.6–33.0)
MCHC: 31.8 g/dL (ref 31.5–35.7)
MCV: 95 fL (ref 79–97)
Monocytes Absolute: 0.5 10*3/uL (ref 0.1–0.9)
Monocytes: 9 %
Neutrophils Absolute: 3.1 10*3/uL (ref 1.4–7.0)
Neutrophils: 63 %
Platelets: 115 10*3/uL — ABNORMAL LOW (ref 150–450)
RBC: 5.13 x10E6/uL (ref 4.14–5.80)
RDW: 13 % (ref 11.6–15.4)
WBC: 5 10*3/uL (ref 3.4–10.8)

## 2022-07-24 LAB — LIPID PANEL
Chol/HDL Ratio: 2.4 ratio (ref 0.0–5.0)
Cholesterol, Total: 137 mg/dL (ref 100–199)
HDL: 57 mg/dL (ref >39)
LDL Chol Calc (NIH): 66 mg/dL (ref 0–99)
Triglycerides: 69 mg/dL (ref 0–149)
VLDL Cholesterol Cal: 14 mg/dL (ref 5–40)

## 2022-07-24 LAB — CMP14+EGFR
ALT: 23 IU/L (ref 0–44)
AST: 23 IU/L (ref 0–40)
Albumin/Globulin Ratio: 2 (ref 1.2–2.2)
Albumin: 4.2 g/dL (ref 3.8–4.8)
Alkaline Phosphatase: 102 IU/L (ref 44–121)
BUN/Creatinine Ratio: 15 (ref 10–24)
BUN: 19 mg/dL (ref 8–27)
Bilirubin Total: 0.7 mg/dL (ref 0.0–1.2)
CO2: 23 mmol/L (ref 20–29)
Calcium: 9.3 mg/dL (ref 8.6–10.2)
Chloride: 104 mmol/L (ref 96–106)
Creatinine, Ser: 1.31 mg/dL — ABNORMAL HIGH (ref 0.76–1.27)
Globulin, Total: 2.1 g/dL (ref 1.5–4.5)
Glucose: 84 mg/dL (ref 70–99)
Potassium: 4.7 mmol/L (ref 3.5–5.2)
Sodium: 141 mmol/L (ref 134–144)
Total Protein: 6.3 g/dL (ref 6.0–8.5)
eGFR: 56 mL/min/{1.73_m2} — ABNORMAL LOW (ref >59)

## 2022-07-24 LAB — VITAMIN B12: Vitamin B-12: 574 pg/mL (ref 232–1245)

## 2022-07-24 LAB — PSA: Prostate Specific Ag, Serum: 3.4 ng/mL (ref 0.0–4.0)

## 2022-07-24 LAB — TSH: TSH: 1.22 u[IU]/mL (ref 0.450–4.500)

## 2022-07-24 MED ORDER — OLMESARTAN MEDOXOMIL 20 MG PO TABS: 20.0000 mg | ORAL_TABLET | Freq: Every day | ORAL | 0 refills | Status: DC

## 2022-07-24 NOTE — Progress Notes (Signed)
Reviewed labs with patient. He has records from [place] and has mild thrombocytopenia for years. No further workup needed at this time. Discussed mild - moderate reduction in kidney function. I recommend starting ARB and discontinuing Hydralazine. Olmesartan on formulary and sent to his pharmacy. He will call and schedule follow up for one month.

## 2022-08-22 ENCOUNTER — Encounter: Payer: Self-pay | Admitting: Urology

## 2022-08-22 ENCOUNTER — Ambulatory Visit (INDEPENDENT_AMBULATORY_CARE_PROVIDER_SITE_OTHER): Payer: Medicare HMO | Admitting: Urology

## 2022-08-22 VITALS — BP 151/71 | HR 72

## 2022-08-22 DIAGNOSIS — N401 Enlarged prostate with lower urinary tract symptoms: Secondary | ICD-10-CM

## 2022-08-22 DIAGNOSIS — C61 Malignant neoplasm of prostate: Secondary | ICD-10-CM

## 2022-08-22 DIAGNOSIS — R350 Frequency of micturition: Secondary | ICD-10-CM

## 2022-08-22 LAB — MICROSCOPIC EXAMINATION
Bacteria, UA: NONE SEEN
WBC, UA: NONE SEEN /hpf (ref 0–5)

## 2022-08-22 LAB — URINALYSIS, ROUTINE W REFLEX MICROSCOPIC
Bilirubin, UA: NEGATIVE
Glucose, UA: NEGATIVE
Leukocytes,UA: NEGATIVE
Nitrite, UA: NEGATIVE
Specific Gravity, UA: 1.03 (ref 1.005–1.030)
Urobilinogen, Ur: 2 mg/dL — ABNORMAL HIGH (ref 0.2–1.0)
pH, UA: 5.5 (ref 5.0–7.5)

## 2022-08-22 LAB — BLADDER SCAN AMB NON-IMAGING: Scan Result: 18

## 2022-08-22 MED ORDER — ALFUZOSIN HCL ER 10 MG PO TB24: 10.0000 mg | ORAL_TABLET | Freq: Every day | ORAL | 11 refills | Status: DC

## 2022-08-22 NOTE — Progress Notes (Signed)
08/22/2022 9:47 AM   Angel Peters Bad March 04, 1946 [id number]  Referring provider: Gardenia Phlegm, MD [address] [place],  [place] [address]  Prostate cancer and BPH   HPI: Mr Angel Peters is a 76yo here for evaluation of prostate cancer and BPH. He was diagnosed with Gleason 3+3=6 in 2/12 cores in 11/2019. His PSA at that time was 6.5. PSA in 06/2022 was 3.4. IPSS 14 QOL 3 on flomax 0.4mg  daily. Nocturia 3x. He has urinary urgency.    PMH: Past Medical History:  Diagnosis Date   A-fib (HCC)    Moderate MR  2D Echo showed EF of 45-50% on 05/08/2009   High cholesterol    Hypertension    Prostate cancer [facility])     Surgical History: Past Surgical History:  Procedure Laterality Date   CATARACT EXTRACTION Bilateral 2023   HERNIA REPAIR      Home Medications:  Allergies as of 08/22/2022   No Known Allergies      Medication List        Accurate as of Aug 22, 2022  9:47 AM. If you have any questions, ask your nurse or doctor.          amLODipine 5 MG tablet Commonly known as: NORVASC Take 5 mg by mouth daily.   apixaban 5 MG Tabs tablet Commonly known as: Eliquis Take 1 tablet by mouth twice daily, need MD appointment for further refills   Coenzyme Q10 100 MG capsule Take 100 mg by mouth daily.   metoprolol succinate 100 MG 24 hr tablet Commonly known as: TOPROL-XL Take 1.5 tablets (150 mg total) by mouth once daily. Take with or immediately following a meal.   multivitamin tablet Take 1 tablet by mouth daily.   olmesartan 20 MG tablet Commonly known as: BENICAR Take 1 tablet (20 mg total) by mouth daily.   rosuvastatin 20 MG tablet Commonly known as: CRESTOR Take 1 tablet (20 mg total) by mouth daily.   tamsulosin 0.4 MG Caps capsule Commonly known as: FLOMAX Take 0.4 mg by mouth daily.   Turmeric 500 MG Tabs Take by mouth. Daily        Allergies: No Known Allergies  Family History: Family History  Problem Relation Age of Onset   Heart  disease Father        died at age 37   Heart disease Other     Social History:  reports that he has never smoked. He does not have any smokeless tobacco history on file. He reports current alcohol use. He reports that he does not use drugs.  ROS: All other review of systems were reviewed and are negative except what is noted above in HPI  Physical Exam: BP (!) 151/71   Pulse 72   Constitutional:  Alert and oriented, No acute distress. HEENT: [place] AT, moist mucus membranes.  Trachea midline, no masses. Cardiovascular: No clubbing, cyanosis, or edema. Respiratory: Normal respiratory effort, no increased work of breathing. GI: Abdomen is soft, nontender, nondistended, no abdominal masses GU: No CVA tenderness. Circumcised phallus. No masses/lesions on penis, testis, scrotum. Prostate 40g smooth no nodules no induration.  Lymph: No cervical or inguinal lymphadenopathy. Skin: No rashes, bruises or suspicious lesions. Neurologic: Grossly intact, no focal deficits, moving all 4 extremities. Psychiatric: Normal mood and affect.  Laboratory Data: Lab Results  Component Value Date   WBC 5.0 07/23/2022   HGB 15.5 07/23/2022   HCT 48.8 07/23/2022   MCV 95 07/23/2022   PLT  115 (L) 07/23/2022    Lab Results  Component Value Date   CREATININE 1.31 (H) 07/23/2022    No results found for: "PSA"  No results found for: "TESTOSTERONE"  No results found for: "HGBA1C"  Urinalysis No results found for: "COLORURINE", "APPEARANCEUR", "LABSPEC", "PHURINE", "GLUCOSEU", "HGBUR", "BILIRUBINUR", "KETONESUR", "PROTEINUR", "UROBILINOGEN", "NITRITE", "LEUKOCYTESUR"  No results found for: "LABMICR", "WBCUA", "RBCUA", "LABEPIT", "MUCUS", "BACTERIA"  Pertinent Imaging:  No results found for this or any previous visit.  No results found for this or any previous visit.  No results found for this or any previous visit.  No results found for this or any previous visit.  No results found for this or  any previous visit.  No valid procedures specified. No results found for this or any previous visit.  No results found for this or any previous visit.   Assessment & Plan:    1. Benign prostatic hyperplasia with urinary frequency - We trial uroxatral 10mg  qhs - Urinalysis, Routine w reflex microscopic - BLADDER SCAN AMB NON-IMAGING  2. Prostate cancer (HCC) -continue surveillance. Followup 6 months with PSA   No follow-ups on file.  Angel Aye, MD  [facility] [place]

## 2022-08-22 NOTE — Progress Notes (Signed)
post void residual=18 

## 2022-08-22 NOTE — Patient Instructions (Signed)

## 2022-08-28 ENCOUNTER — Encounter: Payer: Self-pay | Admitting: Internal Medicine

## 2022-08-28 ENCOUNTER — Other Ambulatory Visit: Payer: Self-pay | Admitting: Internal Medicine

## 2022-08-28 DIAGNOSIS — I10 Essential (primary) hypertension: Secondary | ICD-10-CM

## 2022-08-30 LAB — BMP8+EGFR
BUN/Creatinine Ratio: 14 (ref 10–24)
BUN: 19 mg/dL (ref 8–27)
CO2: 23 mmol/L (ref 20–29)
Calcium: 9.8 mg/dL (ref 8.6–10.2)
Chloride: 103 mmol/L (ref 96–106)
Creatinine, Ser: 1.33 mg/dL — ABNORMAL HIGH (ref 0.76–1.27)
Glucose: 84 mg/dL (ref 70–99)
Potassium: 4.6 mmol/L (ref 3.5–5.2)
Sodium: 141 mmol/L (ref 134–144)
eGFR: 55 mL/min/{1.73_m2} — ABNORMAL LOW (ref >59)

## 2022-09-05 ENCOUNTER — Encounter: Payer: Self-pay | Admitting: Cardiology

## 2022-09-05 ENCOUNTER — Ambulatory Visit: Payer: Medicare HMO | Attending: Cardiology | Admitting: Cardiology

## 2022-09-05 VITALS — BP 130/80 | HR 59 | Ht 69.0 in | Wt 151.2 lb

## 2022-09-05 DIAGNOSIS — I340 Nonrheumatic mitral (valve) insufficiency: Secondary | ICD-10-CM | POA: Diagnosis not present

## 2022-09-05 DIAGNOSIS — I10 Essential (primary) hypertension: Secondary | ICD-10-CM | POA: Diagnosis not present

## 2022-09-05 DIAGNOSIS — E782 Mixed hyperlipidemia: Secondary | ICD-10-CM

## 2022-09-05 DIAGNOSIS — I4811 Longstanding persistent atrial fibrillation: Secondary | ICD-10-CM | POA: Diagnosis not present

## 2022-09-05 NOTE — Progress Notes (Signed)
Clinical Summary Mr. Krammes is a 77 y.o.male seen today as a new consult, referred by Dr Barbaraann Faster for the following medical problems  Previously seen by Dr Rennis Golden, last visit 08/2016  Most recently seen Dr Robyn Haber in [place], [place]. Phone [phone].      1.Long standing persistent Afib - afib at least since 2013 by EKGs, has been asymptomatic and rate controlled He reports at least 30 years - compliant with meds - no bleeding on eliquis   2. Hyperlipidemia 07/2022 TC 137 TG 69 HDL 57 LDL 66   3. HTN - compliant with meds - home bp's 120s/70s   4. Mitral regurgitation - mild MR by 2015 echo - reports more recent echo in [place].    SH: recently moved back to the area, had lived in [place] for 9 years. Has family in this area.   Past Medical History:  Diagnosis Date   A-fib (HCC)    Moderate MR  2D Echo showed EF of 45-50% on 05/08/2009   High cholesterol    Hypertension    Prostate cancer (HCC)      No Known Allergies   Current Outpatient Medications  Medication Sig Dispense Refill   alfuzosin (UROXATRAL) 10 MG 24 hr tablet Take 1 tablet (10 mg total) by mouth at bedtime. 30 tablet 11   amLODipine (NORVASC) 5 MG tablet Take 5 mg by mouth daily.     apixaban (ELIQUIS) 5 MG TABS tablet Take 1 tablet by mouth twice daily, need MD appointment for further refills 180 tablet 0   Coenzyme Q10 100 MG capsule Take 100 mg by mouth daily.     metoprolol succinate (TOPROL-XL) 100 MG 24 hr tablet Take 1.5 tablets (150 mg total) by mouth once daily. Take with or immediately following a meal. 135 tablet 1   Multiple Vitamin (MULTIVITAMIN) tablet Take 1 tablet by mouth daily.     olmesartan (BENICAR) 20 MG tablet Take 1 tablet (20 mg total) by mouth daily. 90 tablet 0   rosuvastatin (CRESTOR) 20 MG tablet Take 1 tablet (20 mg total) by mouth daily. 90 tablet 2   tamsulosin (FLOMAX) 0.4 MG CAPS capsule Take 0.4 mg by mouth daily.     Turmeric 500 MG TABS  Take by mouth. Daily     No current facility-administered medications for this visit.     Past Surgical History:  Procedure Laterality Date   CATARACT EXTRACTION Bilateral 2023   HERNIA REPAIR       No Known Allergies    Family History  Problem Relation Age of Onset   Heart disease Father        died at age 56   Heart disease Other      Social History Mr. Raikes reports that he has never smoked. He does not have any smokeless tobacco history on file. Mr. Goguen reports current alcohol use.   Review of Systems CONSTITUTIONAL: No weight loss, fever, chills, weakness or fatigue.  HEENT: Eyes: No visual loss, blurred vision, double vision or yellow sclerae.No hearing loss, sneezing, congestion, runny nose or sore throat.  SKIN: No rash or itching.  CARDIOVASCULAR: per hpi RESPIRATORY: No shortness of breath, cough or sputum.  GASTROINTESTINAL: No anorexia, nausea, vomiting or diarrhea. No abdominal pain or blood.  GENITOURINARY: No burning on urination, no polyuria NEUROLOGICAL: No headache, dizziness, syncope, paralysis, ataxia, numbness or tingling in the extremities. No change in bowel or bladder control.  MUSCULOSKELETAL: No muscle, back pain,  joint pain or stiffness.  LYMPHATICS: No enlarged nodes. No history of splenectomy.  PSYCHIATRIC: No history of depression or anxiety.  ENDOCRINOLOGIC: No reports of sweating, cold or heat intolerance. No polyuria or polydipsia.  Marland Kitchen   Physical Examination Today's Vitals   09/05/22 0848  BP: 130/80  Pulse: (!) 59  SpO2: 98%  Weight: 151 lb 3.2 oz (68.6 kg)  Height: 5\' 9"  (1.753 m)   Body mass index is 22.33 kg/m.  Gen: resting comfortably, no acute distress HEENT: no scleral icterus, pupils equal round and reactive, no palptable cervical adenopathy,  CV: irreg, no m/rg, no jvd Resp: Clear to auscultation bilaterally GI: abdomen is soft, non-tender, non-distended, normal bowel sounds, no hepatosplenomegaly MSK:  extremities are warm, no edema.  Skin: warm, no rash Neuro:  no focal deficits Psych: appropriate affect   Diagnostic Studies 08/2013 echo Study Conclusions   - Left ventricle: The cavity size was normal. Systolic function was    normal. The estimated ejection fraction was in the range of 50%    to 55%. Wall motion was normal; there were no regional wall    motion abnormalities. The study was not technically sufficient to    allow evaluation of LV diastolic dysfunction due to atrial    fibrillation. There was no evidence of elevated ventricular    filling pressure by Doppler parameters. Mild concentric and    moderate, focal basal septal hypertrophy.  - Aortic valve: There was trivial regurgitation.  - Mitral valve: Mildly thickened leaflets . There was mild    regurgitation.  - Left atrium: The atrium was severely dilated.  - Right atrium: The atrium was mildly to moderately dilated.  - Tricuspid valve: There was mild-moderate regurgitation.  - Pulmonary arteries: PA peak pressure: 31 mm Hg (S)       Assessment and Plan   1.Long standing persistent afib - no symptoms - rate controlled and doing well, continue current meds - continue eliquis for stroke prevention - EKG today shows rate controlled afib  2. HTN - at goal, continue current meds  3. Hyperlipidemia - at goal, continue current meds  4. Mitral regurgitation - mild by echo 2015, no significant murmur on exam - request records from [place] cardiologist in case more recent echo - continue to monitor   F/u 6 months, if doing well likely f/u annually      Antoine Poche, M.D.

## 2022-09-05 NOTE — Patient Instructions (Signed)
Medication Instructions:  Your physician recommends that you continue on your current medications as directed. Please refer to the Current Medication list given to you today.  *If you need a refill on your cardiac medications before your next appointment, please call your pharmacy*   Lab Work: None If you have labs (blood work) drawn today and your tests are completely normal, you will receive your results only by: MyChart Message (if you have MyChart) OR A paper copy in the mail If you have any lab test that is abnormal or we need to change your treatment, we will call you to review the results.   Testing/Procedures: None   Follow-Up: At [facility], you and your health needs are our priority.  As part of our continuing mission to provide you with exceptional heart care, we have created designated Provider Care Teams.  These Care Teams include your primary Cardiologist (physician) and Advanced Practice Providers (APPs -  Physician Assistants and Nurse Practitioners) who all work together to provide you with the care you need, when you need it.  We recommend signing up for the patient portal called "MyChart".  Sign up information is provided on this After Visit Summary.  MyChart is used to connect with patients for Virtual Visits (Telemedicine).  Patients are able to view lab/test results, encounter notes, upcoming appointments, etc.  Non-urgent messages can be sent to your provider as well.   To learn more about what you can do with MyChart, go to [email or web address].    Your next appointment:   6 month(s)  Provider:   Jonathan Branch, MD    Other Instructions    

## 2022-09-24 ENCOUNTER — Ambulatory Visit (INDEPENDENT_AMBULATORY_CARE_PROVIDER_SITE_OTHER): Payer: Medicare HMO

## 2022-09-24 VITALS — BP 104/75 | Ht 69.0 in | Wt 148.0 lb

## 2022-09-24 DIAGNOSIS — Z1159 Encounter for screening for other viral diseases: Secondary | ICD-10-CM

## 2022-09-24 DIAGNOSIS — Z0000 Encounter for general adult medical examination without abnormal findings: Secondary | ICD-10-CM

## 2022-09-24 DIAGNOSIS — Z0100 Encounter for examination of eyes and vision without abnormal findings: Secondary | ICD-10-CM

## 2022-09-24 NOTE — Addendum Note (Signed)
Addended by: Abner Greenspan on: 09/24/2022 03:09 PM   Modules accepted: Orders

## 2022-09-24 NOTE — Patient Instructions (Signed)
Angel Peters , Thank you for taking time to come for your Medicare Wellness Visit. I appreciate your ongoing commitment to your health goals. Please review the following plan we discussed and let me know if I can assist you in the future.   These are the goals we discussed:  Goals      Patient Stated     Patient wants to increase his activity like it was when he lived in [place].         This is a list of the screening recommended for you and due dates:  Health Maintenance  Topic Date Due   Hepatitis C Screening  Never done   Pneumonia Vaccine (2 of 2 - PPSV23 or PCV20) 04/28/2019   COVID-19 Vaccine (6 - 2023-24 season) 03/20/2022   Flu Shot  11/20/2022   Medicare Annual Wellness Visit  09/24/2023   DTaP/Tdap/Td vaccine (2 - Tdap) 03/21/2026   Zoster (Shingles) Vaccine  Completed   HPV Vaccine  Aged Out    Advanced directives: Advance directive discussed with you today. Even though you declined this today, please call our office should you change your mind, and we can give you the proper paperwork for you to fill out. Advance care planning is a way to make decisions about medical care that fits your values in case you are ever unable to make these decisions for yourself.  Information on Advanced Care Planning can be found at [facility] Advance Health Care Directives Advance Health Care Directives ([email or web address]    Conditions/risks identified: Aim for 30 minutes of exercise or brisk walking, 6-8 glasses of water, and 5 servings of fruits and vegetables each day.  A referral has been placed for an ophthalmologist They will call you with an appointment  Next appointment: Follow up in one year for your annual wellness visit. October 14, 2023 at 10:30am TELEPHONE VISIT  Preventive Care 61 Years and Older, Male  Preventive care refers to lifestyle choices and visits with your health care provider that can promote health and wellness. What does preventive care  include? A yearly physical exam. This is also called an annual well check. Dental exams once or twice a year. Routine eye exams. Ask your health care provider how often you should have your eyes checked. Personal lifestyle choices, including: Daily care of your teeth and gums. Regular physical activity. Eating a healthy diet. Avoiding tobacco and drug use. Limiting alcohol use. Practicing safe sex. Taking low doses of aspirin every day. Taking vitamin and mineral supplements as recommended by your health care provider. What happens during an annual well check? The services and screenings done by your health care provider during your annual well check will depend on your age, overall health, lifestyle risk factors, and family history of disease. Counseling  Your health care provider may ask you questions about your: Alcohol use. Tobacco use. Drug use. Emotional well-being. Home and relationship well-being. Sexual activity. Eating habits. History of falls. Memory and ability to understand (cognition). Work and work Astronomer. Screening  You may have the following tests or measurements: Height, weight, and BMI. Blood pressure. Lipid and cholesterol levels. These may be checked every 5 years, or more frequently if you are over 41 years old. Skin check. Lung cancer screening. You may have this screening every year starting at age 95 if you have a 30-pack-year history of smoking and currently smoke or have quit within the past 15 years. Fecal occult blood test (FOBT) of the stool.  You may have this test every year starting at age 62. Flexible sigmoidoscopy or colonoscopy. You may have a sigmoidoscopy every 5 years or a colonoscopy every 10 years starting at age 16. Prostate cancer screening. Recommendations will vary depending on your family history and other risks. Hepatitis C blood test. Hepatitis B blood test. Sexually transmitted disease (STD) testing. Diabetes screening. This  is done by checking your blood sugar (glucose) after you have not eaten for a while (fasting). You may have this done every 1-3 years. Abdominal aortic aneurysm (AAA) screening. You may need this if you are a current or former smoker. Osteoporosis. You may be screened starting at age 82 if you are at high risk. Talk with your health care provider about your test results, treatment options, and if necessary, the need for more tests. Vaccines  Your health care provider may recommend certain vaccines, such as: Influenza vaccine. This is recommended every year. Tetanus, diphtheria, and acellular pertussis (Tdap, Td) vaccine. You may need a Td booster every 10 years. Zoster vaccine. You may need this after age 37. Pneumococcal 13-valent conjugate (PCV13) vaccine. One dose is recommended after age 57. Pneumococcal polysaccharide (PPSV23) vaccine. One dose is recommended after age 65. Talk to your health care provider about which screenings and vaccines you need and how often you need them. This information is not intended to replace advice given to you by your health care provider. Make sure you discuss any questions you have with your health care provider. Document Released: 05/04/2015 Document Revised: 12/26/2015 Document Reviewed: 02/06/2015 Elsevier Interactive Patient Education  2017 [employer].  Fall Prevention in the Home Falls can cause injuries. They can happen to people of all ages. There are many things you can do to make your home safe and to help prevent falls. What can I do on the outside of my home? Regularly fix the edges of walkways and driveways and fix any cracks. Remove anything that might make you trip as you walk through a door, such as a raised step or threshold. Trim any bushes or trees on the path to your home. Use bright outdoor lighting. Clear any walking paths of anything that might make someone trip, such as rocks or tools. Regularly check to see if handrails are  loose or broken. Make sure that both sides of any steps have handrails. Any raised decks and porches should have guardrails on the edges. Have any leaves, snow, or ice cleared regularly. Use sand or salt on walking paths during winter. Clean up any spills in your garage right away. This includes oil or grease spills. What can I do in the bathroom? Use night lights. Install grab bars by the toilet and in the tub and shower. Do not use towel bars as grab bars. Use non-skid mats or decals in the tub or shower. If you need to sit down in the shower, use a plastic, non-slip stool. Keep the floor dry. Clean up any water that spills on the floor as soon as it happens. Remove soap buildup in the tub or shower regularly. Attach bath mats securely with double-sided non-slip rug tape. Do not have throw rugs and other things on the floor that can make you trip. What can I do in the bedroom? Use night lights. Make sure that you have a light by your bed that is easy to reach. Do not use any sheets or blankets that are too big for your bed. They should not hang down onto the floor. Have  a firm chair that has side arms. You can use this for support while you get dressed. Do not have throw rugs and other things on the floor that can make you trip. What can I do in the kitchen? Clean up any spills right away. Avoid walking on wet floors. Keep items that you use a lot in easy-to-reach places. If you need to reach something above you, use a strong step stool that has a grab bar. Keep electrical cords out of the way. Do not use floor polish or wax that makes floors slippery. If you must use wax, use non-skid floor wax. Do not have throw rugs and other things on the floor that can make you trip. What can I do with my stairs? Do not leave any items on the stairs. Make sure that there are handrails on both sides of the stairs and use them. Fix handrails that are broken or loose. Make sure that handrails are as  long as the stairways. Check any carpeting to make sure that it is firmly attached to the stairs. Fix any carpet that is loose or worn. Avoid having throw rugs at the top or bottom of the stairs. If you do have throw rugs, attach them to the floor with carpet tape. Make sure that you have a light switch at the top of the stairs and the bottom of the stairs. If you do not have them, ask someone to add them for you. What else can I do to help prevent falls? Wear shoes that: Do not have high heels. Have rubber bottoms. Are comfortable and fit you well. Are closed at the toe. Do not wear sandals. If you use a stepladder: Make sure that it is fully opened. Do not climb a closed stepladder. Make sure that both sides of the stepladder are locked into place. Ask someone to hold it for you, if possible. Clearly mark and make sure that you can see: Any grab bars or handrails. First and last steps. Where the edge of each step is. Use tools that help you move around (mobility aids) if they are needed. These include: Canes. Walkers. Scooters. Crutches. Turn on the lights when you go into a dark area. Replace any light bulbs as soon as they burn out. Set up your furniture so you have a clear path. Avoid moving your furniture around. If any of your floors are uneven, fix them. If there are any pets around you, be aware of where they are. Review your medicines with your doctor. Some medicines can make you feel dizzy. This can increase your chance of falling. Ask your doctor what other things that you can do to help prevent falls. This information is not intended to replace advice given to you by your health care provider. Make sure you discuss any questions you have with your health care provider. Document Released: 02/01/2009 Document Revised: 09/13/2015 Document Reviewed: 05/12/2014 Elsevier Interactive Patient Education  2017 [employer].

## 2022-09-24 NOTE — Progress Notes (Signed)
I connected with  Angel Peters on 09/24/22 by a audio enabled telemedicine application and verified that I am speaking with the correct person using two identifiers.  Patient Location: Home  Provider Location: Home Office  I discussed the limitations of evaluation and management by telemedicine. The patient expressed understanding and agreed to proceed.  Patient Medicare AWV questionnaire was completed by the patient on 09/20/2022; I have confirmed that all information answered by patient is correct and no changes since this date.    Subjective:   Angel Peters is a 77 y.o. male who presents for an Initial Medicare Annual Wellness Visit.  Review of Systems      Cardiac Risk Factors include: advanced age (>33men, >54 women);dyslipidemia;hypertension;male gender;Other (see comment), Risk factor comments: Chronic A Fib     Objective:    Today's Vitals   09/24/22 0952  BP: 104/75  Weight: 148 lb (67.1 kg)  Height: 5\' 9"  (1.753 m)   Body mass index is 21.86 kg/m.     09/24/2022   10:03 AM  Advanced Directives  Does Patient Have a Medical Advance Directive? No  Would patient like information on creating a medical advance directive? No - Patient declined    Current Medications (verified) Outpatient Encounter Medications as of 09/24/2022  Medication Sig   alfuzosin (UROXATRAL) 10 MG 24 hr tablet Take 1 tablet (10 mg total) by mouth at bedtime.   amLODipine (NORVASC) 5 MG tablet Take 5 mg by mouth daily.   apixaban (ELIQUIS) 5 MG TABS tablet Take 1 tablet by mouth twice daily, need MD appointment for further refills   Coenzyme Q10 100 MG capsule Take 100 mg by mouth daily.   metoprolol succinate (TOPROL-XL) 100 MG 24 hr tablet Take 1.5 tablets (150 mg total) by mouth once daily. Take with or immediately following a meal. (Patient taking differently: Take 200 mg by mouth daily. Take 1.5 tablets (150 mg total) by mouth once daily. Take with or immediately following a meal.)    Multiple Vitamin (MULTIVITAMIN) tablet Take 1 tablet by mouth daily.   olmesartan (BENICAR) 20 MG tablet Take 1 tablet (20 mg total) by mouth daily.   rosuvastatin (CRESTOR) 20 MG tablet Take 1 tablet (20 mg total) by mouth daily.   Turmeric 500 MG TABS Take by mouth. Daily   No facility-administered encounter medications on file as of 09/24/2022.    Allergies (verified) Patient has no known allergies.   History: Past Medical History:  Diagnosis Date   A-fib (HCC)    Moderate MR  2D Echo showed EF of 45-50% on 05/08/2009   High cholesterol    Hypertension    Prostate cancer [facility])    Past Surgical History:  Procedure Laterality Date   CATARACT EXTRACTION Bilateral 2023   HERNIA REPAIR     Family History  Problem Relation Age of Onset   Heart disease Father        died at age 16   Heart disease Other    Social History   Socioeconomic History   Marital status: Married    Spouse name: Not on file   Number of children: 2   Years of education: Not on file   Highest education level: Not on file  Occupational History   Not on file  Tobacco Use   Smoking status: Never   Smokeless tobacco: Not on file  Vaping Use   Vaping Use: Never used  Substance and Sexual Activity   Alcohol use: Yes  Comment: socially   Drug use: No   Sexual activity: Not on file  Other Topics Concern   Not on file  Social History Narrative   Patient lives with his wife and recently moved back to [place] from [place].    Social Determinants of Health   Financial Resource Strain: Low Risk  (09/24/2022)   Overall Financial Resource Strain (CARDIA)    Difficulty of Paying Living Expenses: Not hard at all  Food Insecurity: No Food Insecurity (09/24/2022)   Hunger Vital Sign    Worried About Running Out of Food in the Last Year: Never true    Ran Out of Food in the Last Year: Never true  Transportation Needs: No Transportation Needs (09/24/2022)   PRAPARE - Administrator, Civil Service  (Medical): No    Lack of Transportation (Non-Medical): No  Physical Activity: Sufficiently Active (09/24/2022)   Exercise Vital Sign    Days of Exercise per Week: 3 days    Minutes of Exercise per Session: 60 min  Stress: No Stress Concern Present (09/24/2022)   [employer] of Occupational Health - Occupational Stress Questionnaire    Feeling of Stress : Not at all  Social Connections: Moderately Integrated (09/24/2022)   Social Connection and Isolation Panel [NHANES]    Frequency of Communication with Friends and Family: Once a week    Frequency of Social Gatherings with Friends and Family: Once a week    Attends Religious Services: More than 4 times per year    Active Member of [employer] or Organizations: Yes    Attends Engineer, structural: More than 4 times per year    Marital Status: Married    Tobacco Counseling Counseling given: Yes   Clinical Intake:  Pre-visit preparation completed: Yes  Pain : No/denies pain     BMI - recorded: 21.86 Nutritional Status: BMI of 19-24  Normal Nutritional Risks: None Diabetes: No  How often do you need to have someone help you when you read instructions, pamphlets, or other written materials from your doctor or pharmacy?: 1 - Never  Diabetic?  Interpreter Needed?: No  Information entered by :: Abby Yanis Larin, CMA   Activities of Daily Living    09/24/2022   10:02 AM 09/20/2022    9:46 AM  In your present state of health, do you have any difficulty performing the following activities:  Hearing? 0 0  Vision? 0 0  Difficulty concentrating or making decisions? 0 0  Walking or climbing stairs? 0 0  Dressing or bathing? 0 0  Doing errands, shopping? 0 0  Preparing Food and eating ? N N  Using the Toilet? N N  In the past six months, have you accidently leaked urine? Y Y  Do you have problems with loss of bowel control? N N  Managing your Medications? N N  Managing your Finances? N N  Housekeeping or managing your  Housekeeping? N N    Patient Care Team: Billie Lade, MD as PCP - General (Internal Medicine) Wyline Mood Dorothe Pea, MD as PCP - Cardiology (Cardiology)  Indicate any recent Medical Services you may have received from other than [facility] providers in the past year (date may be approximate).     Assessment:   This is a routine wellness examination for Angel Peters.  Hearing/Vision screen Hearing Screening - Comments:: Patient denies any hearing difficulties.   Vision Screening - Comments:: Referral placed for eye doctor. Patient hasn't established with one since moving to [facility]  Dietary issues and exercise activities discussed: Current Exercise Habits: Home exercise routine, Type of exercise: walking, Time (Minutes): 45, Frequency (Times/Week): 7, Weekly Exercise (Minutes/Week): 315, Intensity: Mild, Exercise limited by: orthopedic condition(s)   Goals Addressed             This Visit's Progress    Patient Stated       Patient wants to increase his activity like it was when he lived in [place].        Depression Screen    09/24/2022   10:02 AM 07/07/2022    3:29 PM  PHQ 2/9 Scores  PHQ - 2 Score 0 0    Fall Risk    09/24/2022    9:57 AM 09/20/2022    9:46 AM 07/07/2022    3:28 PM  Fall Risk   Falls in the past year? 0 0 0  Number falls in past yr: 0 0 0  Injury with Fall? 0 0 0  Risk for fall due to : No Fall Risks    Follow up Falls prevention discussed      FALL RISK PREVENTION PERTAINING TO THE HOME:  Any stairs in or around the home? Yes  If so, are there any without handrails? No  Home free of loose throw rugs in walkways, pet beds, electrical cords, etc? Yes  Adequate lighting in your home to reduce risk of falls? Yes   ASSISTIVE DEVICES UTILIZED TO PREVENT FALLS:  Life alert? No  Use of a cane, walker or w/c? No  Grab bars in the bathroom? No  Shower chair or bench in shower? No  Elevated toilet seat or a handicapped toilet? No   TIMED UP AND GO:  Was the  test performed? No .   Cognitive Function:        09/24/2022   10:03 AM  6CIT Screen  What Year? 0 points  What month? 0 points  What time? 0 points  Count back from 20 0 points  Months in reverse 0 points  Repeat phrase 0 points  Total Score 0 points    Immunizations Immunization History  Administered Date(s) Administered   Covid-19, Mrna,Vaccine(Spikevax)23yrs and older 01/23/2022   DTaP 03/21/2016   Influenza-Unspecified 01/19/2014   Moderna Covid-19 Vaccine Bivalent Booster 54yrs & up 01/30/2021   Moderna Sars-Covid-2 Vaccination 07/02/2019, 08/02/2019, 02/15/2020   Pneumococcal Conjugate-13 03/22/2010, 04/27/2018   Zoster Recombinat (Shingrix) 12/21/2019, 02/20/2020    TDAP status: Up to date  Flu Vaccine status: Up to date  Pneumococcal vaccine status: Due, Education has been provided regarding the importance of this vaccine. Advised may receive this vaccine at local pharmacy or Health Dept. Aware to provide a copy of the vaccination record if obtained from local pharmacy or Health Dept. Verbalized acceptance and understanding.  Covid-19 vaccine status: Information provided on how to obtain vaccines.   Qualifies for Shingles Vaccine? Yes   Zostavax completed Yes   Shingrix Completed?: Yes  Screening Tests Health Maintenance  Topic Date Due   Hepatitis C Screening  Never done   Pneumonia Vaccine 50+ Years old (2 of 2 - PPSV23 or PCV20) 04/28/2019   COVID-19 Vaccine (6 - 2023-24 season) 03/20/2022   INFLUENZA VACCINE  11/20/2022   Medicare Annual Wellness (AWV)  09/24/2023   DTaP/Tdap/Td (2 - Tdap) 03/21/2026   Zoster Vaccines- Shingrix  Completed   HPV VACCINES  Aged Out    Health Maintenance  Health Maintenance Due  Topic Date Due   Hepatitis C Screening  Never  done   Pneumonia Vaccine 19+ Years old (2 of 2 - PPSV23 or PCV20) 04/28/2019   COVID-19 Vaccine (6 - 2023-24 season) 03/20/2022    Colorectal cancer screening: No longer required.   Lung  Cancer Screening: (Low Dose CT Chest recommended if Age 39-80 years, 30 pack-year currently smoking OR have quit w/in 15years.) does not qualify.    Additional Screening:  Hepatitis C Screening: does qualify; Ordered 09/24/22  Vision Screening: Recommended annual ophthalmology exams for early detection of glaucoma and other disorders of the eye. Is the patient up to date with their annual eye exam?  No  Who is the provider or what is the name of the office in which the patient attends annual eye exams? Referral placed  If pt is not established with a provider, would they like to be referred to a provider to establish care? Yes .   Dental Screening: Recommended annual dental exams for proper oral hygiene  Community Resource Referral / Chronic Care Management: CRR required this visit?  No   CCM required this visit?  No      Plan:     I have personally reviewed and noted the following in the patient's chart:   Medical and social history Use of alcohol, tobacco or illicit drugs  Current medications and supplements including opioid prescriptions. Patient is not currently taking opioid prescriptions. Functional ability and status Nutritional status Physical activity Advanced directives List of other physicians Hospitalizations, surgeries, and ER visits in previous 12 months Vitals Screenings to include cognitive, depression, and falls Referrals and appointments  In addition, I have reviewed and discussed with patient certain preventive protocols, quality metrics, and best practice recommendations. A written personalized care plan for preventive services as well as general preventive health recommendations were provided to patient.   Due to this being a telephonic visit, the after visit summary with patients personalized plan was offered to patient via mail or my-chart. Patient would like to access their AVS via my-chart    Jordan Hawks Cornisha Zetino, CMA   09/24/2022   Nurse Notes: Referral  placed for ophthalmology And Hep C Screeninng

## 2022-09-25 LAB — HEPATITIS C ANTIBODY: Hep C Virus Ab: NONREACTIVE

## 2022-10-09 ENCOUNTER — Other Ambulatory Visit: Payer: Self-pay

## 2022-10-09 DIAGNOSIS — I10 Essential (primary) hypertension: Secondary | ICD-10-CM

## 2022-10-09 MED ORDER — OLMESARTAN MEDOXOMIL 20 MG PO TABS
20.0000 mg | ORAL_TABLET | Freq: Every day | ORAL | 0 refills | Status: DC
Start: 2022-10-09 — End: 2022-12-23

## 2022-12-03 DIAGNOSIS — Z961 Presence of intraocular lens: Secondary | ICD-10-CM | POA: Diagnosis not present

## 2022-12-03 DIAGNOSIS — H401131 Primary open-angle glaucoma, bilateral, mild stage: Secondary | ICD-10-CM | POA: Diagnosis not present

## 2022-12-20 ENCOUNTER — Other Ambulatory Visit: Payer: Self-pay | Admitting: Internal Medicine

## 2022-12-20 DIAGNOSIS — I10 Essential (primary) hypertension: Secondary | ICD-10-CM

## 2023-01-05 ENCOUNTER — Encounter: Payer: Self-pay | Admitting: Internal Medicine

## 2023-01-05 ENCOUNTER — Telehealth: Payer: Self-pay | Admitting: Internal Medicine

## 2023-01-05 NOTE — Telephone Encounter (Signed)
Patient wife calling wanting to know if pt needs to come before his appt on thurs to get lab work done. Please advise Thank you

## 2023-01-06 ENCOUNTER — Other Ambulatory Visit: Payer: Self-pay

## 2023-01-06 NOTE — Telephone Encounter (Signed)
Patient calling back, does patient need to do blood work for his Thursday phsycial.?

## 2023-01-08 ENCOUNTER — Ambulatory Visit: Payer: Medicare HMO | Admitting: Internal Medicine

## 2023-01-08 ENCOUNTER — Encounter: Payer: Self-pay | Admitting: Internal Medicine

## 2023-01-08 VITALS — BP 128/66 | HR 72 | Ht 69.0 in | Wt 160.0 lb

## 2023-01-08 DIAGNOSIS — E782 Mixed hyperlipidemia: Secondary | ICD-10-CM | POA: Diagnosis not present

## 2023-01-08 DIAGNOSIS — N400 Enlarged prostate without lower urinary tract symptoms: Secondary | ICD-10-CM | POA: Diagnosis not present

## 2023-01-08 DIAGNOSIS — I10 Essential (primary) hypertension: Secondary | ICD-10-CM

## 2023-01-08 DIAGNOSIS — I482 Chronic atrial fibrillation, unspecified: Secondary | ICD-10-CM | POA: Diagnosis not present

## 2023-01-08 DIAGNOSIS — Z23 Encounter for immunization: Secondary | ICD-10-CM

## 2023-01-08 NOTE — Patient Instructions (Signed)
It was a pleasure to see you today.  Thank you for giving [place] the opportunity to be involved in your care.  Below is a brief recap of your visit and next steps.  We will plan to see you again in 6 months.  Summary No medication changes today Flu shot today We will plan for follow up in 6 months

## 2023-01-08 NOTE — Progress Notes (Signed)
Established Patient Office Visit  Subjective   Patient ID: ZY HEDGLIN, male    DOB: 03-21-1946  Age: 77 y.o. MRN: [id number]  Chief Complaint  Patient presents with   Hypertension    Six month follow up    Mr. Karber returns to care today for routine follow-up.  He was last evaluated at [facility] on 3/18 by Dr. Barbaraann Faster as a new patient presenting to establish care.  Baseline labs were ordered and referrals were placed to cardiology and urology.  In the interim, he has established care with both urology and cardiology.  There have otherwise been no acute interval events.  Mr. Antonovich reports feeling well today.  He is asymptomatic and has no acute concerns to discuss.  Past Medical History:  Diagnosis Date   A-fib (HCC)    Moderate MR  2D Echo showed EF of 45-50% on 05/08/2009   High cholesterol    Hypertension    Prostate cancer [facility])    Past Surgical History:  Procedure Laterality Date   CATARACT EXTRACTION Bilateral 2023   HERNIA REPAIR     Social History   Tobacco Use   Smoking status: Never  Vaping Use   Vaping status: Never Used  Substance Use Topics   Alcohol use: Yes    Comment: socially   Drug use: No   Family History  Problem Relation Age of Onset   Heart disease Father        died at age 76   Heart disease Other    No Known Allergies  Review of Systems  Constitutional:  Negative for chills and fever.  HENT:  Negative for sore throat.   Respiratory:  Negative for cough and shortness of breath.   Cardiovascular:  Negative for chest pain, palpitations and leg swelling.  Gastrointestinal:  Negative for abdominal pain, blood in stool, constipation, diarrhea, nausea and vomiting.  Genitourinary:  Negative for dysuria and hematuria.  Musculoskeletal:  Negative for myalgias.  Skin:  Negative for itching and rash.  Neurological:  Negative for dizziness and headaches.  Psychiatric/Behavioral:  Negative for depression and suicidal ideas.       Objective:      BP 128/66   Pulse 72   Ht 5\' 9"  (1.753 m)   Wt 160 lb (72.6 kg)   SpO2 96%   BMI 23.63 kg/m  BP Readings from Last 3 Encounters:  01/08/23 128/66  09/24/22 104/75  09/05/22 130/80   Physical Exam Vitals reviewed.  Constitutional:      General: He is not in acute distress.    Appearance: Normal appearance. He is not ill-appearing.  HENT:     Head: Normocephalic and atraumatic.     Right Ear: External ear normal.     Left Ear: External ear normal.     Nose: Nose normal. No congestion or rhinorrhea.     Mouth/Throat:     Mouth: Mucous membranes are moist.     Pharynx: Oropharynx is clear.  Eyes:     General: No scleral icterus.    Extraocular Movements: Extraocular movements intact.     Conjunctiva/sclera: Conjunctivae normal.     Pupils: Pupils are equal, round, and reactive to light.  Cardiovascular:     Rate and Rhythm: Normal rate. Rhythm irregular.     Pulses: Normal pulses.     Heart sounds: Normal heart sounds. No murmur heard. Pulmonary:     Effort: Pulmonary effort is normal.     Breath sounds: Normal breath sounds.  No wheezing, rhonchi or rales.  Abdominal:     General: Abdomen is flat. Bowel sounds are normal. There is no distension.     Palpations: Abdomen is soft.     Tenderness: There is no abdominal tenderness.  Musculoskeletal:        General: No swelling or deformity. Normal range of motion.     Cervical back: Normal range of motion.  Skin:    General: Skin is warm and dry.     Capillary Refill: Capillary refill takes less than 2 seconds.  Neurological:     General: No focal deficit present.     Mental Status: He is alert and oriented to person, place, and time.     Motor: No weakness.  Psychiatric:        Mood and Affect: Mood normal.        Behavior: Behavior normal.        Thought Content: Thought content normal.   Last CBC Lab Results  Component Value Date   WBC 5.0 07/23/2022   HGB 15.5 07/23/2022   HCT 48.8 07/23/2022   MCV 95  07/23/2022   MCH 30.2 07/23/2022   RDW 13.0 07/23/2022   PLT 115 (L) 07/23/2022   Last metabolic panel Lab Results  Component Value Date   GLUCOSE 84 08/29/2022   NA 141 08/29/2022   K 4.6 08/29/2022   CL 103 08/29/2022   CO2 23 08/29/2022   BUN 19 08/29/2022   CREATININE 1.33 (H) 08/29/2022   EGFR 55 (L) 08/29/2022   CALCIUM 9.8 08/29/2022   PROT 6.3 07/23/2022   ALBUMIN 4.2 07/23/2022   LABGLOB 2.1 07/23/2022   AGRATIO 2.0 07/23/2022   BILITOT 0.7 07/23/2022   ALKPHOS 102 07/23/2022   AST 23 07/23/2022   ALT 23 07/23/2022   Last lipids Lab Results  Component Value Date   CHOL 137 07/23/2022   HDL 57 07/23/2022   LDLCALC 66 07/23/2022   TRIG 69 07/23/2022   CHOLHDL 2.4 07/23/2022   Last thyroid functions Lab Results  Component Value Date   TSH 1.220 07/23/2022   Last vitamin B12 and Folate Lab Results  Component Value Date   VITAMINB12 574 07/23/2022   The 10-year ASCVD risk score (Arnett DK, et al., 2019) is: 28.1%    Assessment & Plan:   Problem List Items Addressed This Visit       Chronic a-fib (HCC)    Irregularly irregular rate and rhythm detected on exam today.  He has establish care with cardiology (Dr. Wyline Mood).  No medication changes indicated today.  He will continue Toprol-XL and Eliquis.      Essential hypertension    Remains adequately controlled on current antihypertensive regimen consisting of amlodipine 5 mg daily, Toprol-XL 100 mg twice daily, and olmesartan 20 mg daily.  No medication changes are indicated today.      BPH (benign prostatic hyperplasia)    He has establish care with urology (Dr. Ronne Binning).  Symptoms are adequately controlled with alfuzosin.  No medication changes are indicated today.      Hyperlipidemia    Lipid panel updated in April.  Total cholesterol 137 and LDL 66.  He is currently prescribed rosuvastatin 20 mg daily.  No medication changes are indicated today.      Need for influenza vaccination     Influenza vaccine administered today      Return in about 6 months (around 07/08/2023).   Billie Lade, MD

## 2023-01-09 ENCOUNTER — Encounter: Payer: Self-pay | Admitting: Internal Medicine

## 2023-01-13 ENCOUNTER — Encounter: Payer: Self-pay | Admitting: Urology

## 2023-01-14 ENCOUNTER — Encounter: Payer: Self-pay | Admitting: Internal Medicine

## 2023-01-14 DIAGNOSIS — Z23 Encounter for immunization: Secondary | ICD-10-CM | POA: Insufficient documentation

## 2023-01-14 DIAGNOSIS — N400 Enlarged prostate without lower urinary tract symptoms: Secondary | ICD-10-CM | POA: Insufficient documentation

## 2023-01-14 NOTE — Assessment & Plan Note (Signed)
Remains adequately controlled on current antihypertensive regimen consisting of amlodipine 5 mg daily, Toprol-XL 100 mg twice daily, and olmesartan 20 mg daily.  No medication changes are indicated today.

## 2023-01-14 NOTE — Assessment & Plan Note (Signed)
Irregularly irregular rate and rhythm detected on exam today.  He has establish care with cardiology (Dr. Wyline Mood).  No medication changes indicated today.  He will continue Toprol-XL and Eliquis.

## 2023-01-14 NOTE — Assessment & Plan Note (Signed)
Lipid panel updated in April.  Total cholesterol 137 and LDL 66.  He is currently prescribed rosuvastatin 20 mg daily.  No medication changes are indicated today.

## 2023-01-14 NOTE — Assessment & Plan Note (Signed)
Influenza vaccine administered today.

## 2023-01-14 NOTE — Assessment & Plan Note (Signed)
He has establish care with urology (Dr. Ronne Binning).  Symptoms are adequately controlled with alfuzosin.  No medication changes are indicated today.

## 2023-02-13 ENCOUNTER — Other Ambulatory Visit: Payer: Medicare HMO

## 2023-02-13 DIAGNOSIS — C61 Malignant neoplasm of prostate: Secondary | ICD-10-CM

## 2023-02-14 LAB — PSA, TOTAL AND FREE
PSA, Free Pct: 37.2 %
PSA, Free: 1.75 ng/mL
Prostate Specific Ag, Serum: 4.7 ng/mL — ABNORMAL HIGH (ref 0.0–4.0)

## 2023-02-20 ENCOUNTER — Ambulatory Visit: Payer: Medicare HMO | Admitting: Urology

## 2023-02-20 VITALS — BP 113/71 | HR 65

## 2023-02-20 DIAGNOSIS — R350 Frequency of micturition: Secondary | ICD-10-CM

## 2023-02-20 DIAGNOSIS — N401 Enlarged prostate with lower urinary tract symptoms: Secondary | ICD-10-CM | POA: Diagnosis not present

## 2023-02-20 DIAGNOSIS — C61 Malignant neoplasm of prostate: Secondary | ICD-10-CM

## 2023-02-20 DIAGNOSIS — R3915 Urgency of urination: Secondary | ICD-10-CM | POA: Diagnosis not present

## 2023-02-20 LAB — URINALYSIS, ROUTINE W REFLEX MICROSCOPIC
Bilirubin, UA: NEGATIVE
Glucose, UA: NEGATIVE
Ketones, UA: NEGATIVE
Leukocytes,UA: NEGATIVE
Nitrite, UA: NEGATIVE
Specific Gravity, UA: 1.03 (ref 1.005–1.030)
Urobilinogen, Ur: 1 mg/dL (ref 0.2–1.0)
pH, UA: 6 (ref 5.0–7.5)

## 2023-02-20 LAB — MICROSCOPIC EXAMINATION: Bacteria, UA: NONE SEEN

## 2023-02-20 NOTE — Progress Notes (Unsigned)
02/20/2023 9:48 AM   Angel Peters November 12, 1945 [id number]  Referring provider: Billie Lade, MD [address] [place],  [place] [address]  Followup prostate cancer   HPI: Angel Peters is a 76yo here for followup for prostate cancer. PSA increased to 4.7 from 3.4. He PSA are usually in the mid 4s. IPSS 14 QOL 3. He has occasional urgency but he is not bothered by the infrequent urgency. No dysuria or hematuria   PMH: Past Medical History:  Diagnosis Date   A-fib (HCC)    Moderate Angel  2D Echo showed EF of 45-50% on 05/08/2009   High cholesterol    Hypertension    Prostate cancer [facility])     Surgical History: Past Surgical History:  Procedure Laterality Date   CATARACT EXTRACTION Bilateral 2023   HERNIA REPAIR      Home Medications:  Allergies as of 02/20/2023   No Known Allergies      Medication List        Accurate as of February 20, 2023  9:48 AM. If you have any questions, ask your nurse or doctor.          alfuzosin 10 MG 24 hr tablet Commonly known as: UROXATRAL Take 1 tablet (10 mg total) by mouth at bedtime.   amLODipine 5 MG tablet Commonly known as: NORVASC Take 5 mg by mouth daily.   apixaban 5 MG Tabs tablet Commonly known as: Eliquis Take 1 tablet by mouth twice daily, need MD appointment for further refills   Coenzyme Q10 100 MG capsule Take 100 mg by mouth daily.   metoprolol succinate 100 MG 24 hr tablet Commonly known as: TOPROL-XL Take 1.5 tablets (150 mg total) by mouth once daily. Take with or immediately following a meal. What changed:  how much to take how to take this when to take this   multivitamin tablet Take 1 tablet by mouth daily.   olmesartan 20 MG tablet Commonly known as: BENICAR TAKE 1 TABLET BY MOUTH DAILY.   Prevnar 20 0.5 ML injection Generic drug: pneumococcal 20-valent conjugate vaccine   rosuvastatin 20 MG tablet Commonly known as: CRESTOR Take 1 tablet (20 mg total) by mouth daily.    Spikevax syringe Generic drug: COVID-19 mRNA vaccine   Turmeric 500 MG Tabs Take by mouth. Daily        Allergies: No Known Allergies  Family History: Family History  Problem Relation Age of Onset   Heart disease Father        died at age 2   Heart disease Other     Social History:  reports that he has never smoked. He does not have any smokeless tobacco history on file. He reports current alcohol use. He reports that he does not use drugs.  ROS: All other review of systems were reviewed and are negative except what is noted above in HPI  Physical Exam: BP 113/71   Pulse 65   Constitutional:  Alert and oriented, No acute distress. HEENT: [place] AT, moist mucus membranes.  Trachea midline, no masses. Cardiovascular: No clubbing, cyanosis, or edema. Respiratory: Normal respiratory effort, no increased work of breathing. GI: Abdomen is soft, nontender, nondistended, no abdominal masses GU: No CVA tenderness.  Lymph: No cervical or inguinal lymphadenopathy. Skin: No rashes, bruises or suspicious lesions. Neurologic: Grossly intact, no focal deficits, moving all 4 extremities. Psychiatric: Normal mood and affect.  Laboratory Data: Lab Results  Component Value Date   WBC 5.0 07/23/2022  HGB 15.5 07/23/2022   HCT 48.8 07/23/2022   MCV 95 07/23/2022   PLT 115 (L) 07/23/2022    Lab Results  Component Value Date   CREATININE 1.33 (H) 08/29/2022    No results found for: "PSA"  No results found for: "TESTOSTERONE"  No results found for: "HGBA1C"  Urinalysis    Component Value Date/Time   APPEARANCEUR Clear 08/22/2022 0939   GLUCOSEU Negative 08/22/2022 0939   BILIRUBINUR Negative 08/22/2022 0939   PROTEINUR 3+ (A) 08/22/2022 0939   NITRITE Negative 08/22/2022 0939   LEUKOCYTESUR Negative 08/22/2022 0939    Lab Results  Component Value Date   LABMICR See below: 08/22/2022   WBCUA None seen 08/22/2022   LABEPIT 0-10 08/22/2022   BACTERIA None seen  08/22/2022    Pertinent Imaging:  No results found for this or any previous visit.  No results found for this or any previous visit.  No results found for this or any previous visit.  No results found for this or any previous visit.  No results found for this or any previous visit.  No valid procedures specified. No results found for this or any previous visit.  No results found for this or any previous visit.   Assessment & Plan:    1. Prostate cancer (HCC) Followup 6 months with PSA - Urinalysis, Routine w reflex microscopic  2. Benign prostatic hyperplasia with urinary frequency Continue uroxatral 10mg  qhs  3. Urinary urgency Continue uroxatral 10mg  qhs   No follow-ups on file.  Wilkie Aye, MD  [facility] [place]

## 2023-02-24 ENCOUNTER — Encounter: Payer: Self-pay | Admitting: Urology

## 2023-02-24 NOTE — Patient Instructions (Signed)

## 2023-03-05 ENCOUNTER — Encounter: Payer: Self-pay | Admitting: Cardiology

## 2023-03-05 ENCOUNTER — Ambulatory Visit: Payer: Medicare HMO | Attending: Cardiology | Admitting: Cardiology

## 2023-03-05 VITALS — BP 120/72 | HR 56 | Ht 69.0 in | Wt 161.6 lb

## 2023-03-05 DIAGNOSIS — I4811 Longstanding persistent atrial fibrillation: Secondary | ICD-10-CM

## 2023-03-05 DIAGNOSIS — I10 Essential (primary) hypertension: Secondary | ICD-10-CM | POA: Diagnosis not present

## 2023-03-05 DIAGNOSIS — I340 Nonrheumatic mitral (valve) insufficiency: Secondary | ICD-10-CM

## 2023-03-05 DIAGNOSIS — E782 Mixed hyperlipidemia: Secondary | ICD-10-CM

## 2023-03-05 NOTE — Progress Notes (Signed)
Clinical Summary Angel Peters is a 77 y.o.male seen today for follow up of the following medical problems.   Previously seen by Dr Rennis Golden, last visit 08/2016   Most recently seen Dr Robyn Haber in [place], [place]. Phone [phone].        1.Long standing persistent Afib - afib at least since 2013 by EKGs, has been asymptomatic and rate controlled He reports at least 30 year history  - no recent palpitations - compliant with meds, no bleeding eliquis.        2. Hyperlipidemia 07/2022 TC 137 TG 69 HDL 57 LDL 66     3. HTN - home bp's 120s/70s - compliant with meds.      4. Mitral regurgitation - mild MR by 2015 echo - reports more recent echo in [place].      SH: recently moved back to the area, had lived in [place] for 9 years. Has family in this area.  Past Medical History:  Diagnosis Date   A-fib (HCC)    Moderate MR  2D Echo showed EF of 45-50% on 05/08/2009   High cholesterol    Hypertension    Prostate cancer (HCC)      No Known Allergies   Current Outpatient Medications  Medication Sig Dispense Refill   alfuzosin (UROXATRAL) 10 MG 24 hr tablet Take 1 tablet (10 mg total) by mouth at bedtime. 30 tablet 11   amLODipine (NORVASC) 5 MG tablet Take 5 mg by mouth daily.     apixaban (ELIQUIS) 5 MG TABS tablet Take 1 tablet by mouth twice daily, need MD appointment for further refills 180 tablet 0   Coenzyme Q10 100 MG capsule Take 100 mg by mouth daily.     metoprolol succinate (TOPROL-XL) 100 MG 24 hr tablet Take 1.5 tablets (150 mg total) by mouth once daily. Take with or immediately following a meal. (Patient taking differently: Take 200 mg by mouth daily. Take 1.5 tablets (150 mg total) by mouth once daily. Take with or immediately following a meal.) 135 tablet 1   Multiple Vitamin (MULTIVITAMIN) tablet Take 1 tablet by mouth daily.     olmesartan (BENICAR) 20 MG tablet TAKE 1 TABLET BY MOUTH DAILY. 90 tablet 3   PREVNAR 20 0.5 ML injection       rosuvastatin (CRESTOR) 20 MG tablet Take 1 tablet (20 mg total) by mouth daily. 90 tablet 2   SPIKEVAX syringe      Turmeric 500 MG TABS Take by mouth. Daily     No current facility-administered medications for this visit.     Past Surgical History:  Procedure Laterality Date   CATARACT EXTRACTION Bilateral 2023   HERNIA REPAIR       No Known Allergies    Family History  Problem Relation Age of Onset   Heart disease Father        died at age 36   Heart disease Other      Social History Angel Peters reports that he has never smoked. He does not have any smokeless tobacco history on file. Angel Peters reports current alcohol use.   Review of Systems CONSTITUTIONAL: No weight loss, fever, chills, weakness or fatigue.  HEENT: Eyes: No visual loss, blurred vision, double vision or yellow sclerae.No hearing loss, sneezing, congestion, runny nose or sore throat.  SKIN: No rash or itching.  CARDIOVASCULAR: per hpi RESPIRATORY: No shortness of breath, cough or sputum.  GASTROINTESTINAL: No anorexia, nausea, vomiting or diarrhea. No  abdominal pain or blood.  GENITOURINARY: No burning on urination, no polyuria NEUROLOGICAL: No headache, dizziness, syncope, paralysis, ataxia, numbness or tingling in the extremities. No change in bowel or bladder control.  MUSCULOSKELETAL: No muscle, back pain, joint pain or stiffness.  LYMPHATICS: No enlarged nodes. No history of splenectomy.  PSYCHIATRIC: No history of depression or anxiety.  ENDOCRINOLOGIC: No reports of sweating, cold or heat intolerance. No polyuria or polydipsia.  Marland Kitchen   Physical Examination Today's Vitals   03/05/23 0906  BP: 120/72  Pulse: (!) 56  Weight: 161 lb 9.6 oz (73.3 kg)  Height: 5\' 9"  (1.753 m)   Body mass index is 23.86 kg/m.  Gen: resting comfortably, no acute distress HEENT: no scleral icterus, pupils equal round and reactive, no palptable cervical adenopathy,  CV: irreg, no m/rg, no jvd Resp: Clear  to auscultation bilaterally GI: abdomen is soft, non-tender, non-distended, normal bowel sounds, no hepatosplenomegaly MSK: extremities are warm, no edema.  Skin: warm, no rash Neuro:  no focal deficits Psych: appropriate affect   Diagnostic Studies 08/2013 echo Study Conclusions   - Left ventricle: The cavity size was normal. Systolic function was    normal. The estimated ejection fraction was in the range of 50%    to 55%. Wall motion was normal; there were no regional wall    motion abnormalities. The study was not technically sufficient to    allow evaluation of LV diastolic dysfunction due to atrial    fibrillation. There was no evidence of elevated ventricular    filling pressure by Doppler parameters. Mild concentric and    moderate, focal basal septal hypertrophy.  - Aortic valve: There was trivial regurgitation.  - Mitral valve: Mildly thickened leaflets . There was mild    regurgitation.  - Left atrium: The atrium was severely dilated.  - Right atrium: The atrium was mildly to moderately dilated.  - Tricuspid valve: There was mild-moderate regurgitation.  - Pulmonary arteries: PA peak pressure: 31 mm Hg (S)             Assessment and Plan   1.Long standing persistent afib/acquired thrombophilia - no symptoms, has done well for long time with just rate control - continue current meds including eliquis for stroke prevention   2. HTN - his bp is at goal, continue current meds   3. Hyperlipidemia -lipids are at goal, continue current meds   4. Mitral regurgitation - mild by echo 2015, no significant murmur on exam - we are request his most recent echo from [place] again   F/u 1 year  Antoine Poche, M.D.

## 2023-03-05 NOTE — Patient Instructions (Addendum)
Medication Instructions:  Your physician recommends that you continue on your current medications as directed. Please refer to the Current Medication list given to you today.  Labwork: none  Testing/Procedures: non  Follow-Up: Your physician recommends that you schedule a follow-up appointment in: 1 year. You will receive a reminder call in about 10 months reminding you to schedule your appointment. If you don't receive this call, please contact our office.  Any Other Special Instructions Will Be Listed Below (If Applicable).  If you need a refill on your cardiac medications before your next appointment, please call your pharmacy.

## 2023-06-18 ENCOUNTER — Other Ambulatory Visit: Payer: Self-pay | Admitting: Urology

## 2023-07-17 ENCOUNTER — Encounter: Payer: Self-pay | Admitting: Internal Medicine

## 2023-07-20 ENCOUNTER — Other Ambulatory Visit: Payer: Self-pay

## 2023-07-20 MED ORDER — ROSUVASTATIN CALCIUM 20 MG PO TABS: 20.0000 mg | ORAL_TABLET | Freq: Every day | ORAL | 2 refills | Status: DC

## 2023-07-20 MED ORDER — AMLODIPINE BESYLATE 5 MG PO TABS: 5.0000 mg | ORAL_TABLET | Freq: Every day | ORAL | 0 refills | Status: DC

## 2023-07-20 MED ORDER — METOPROLOL SUCCINATE ER 100 MG PO TB24: 100.0000 mg | ORAL_TABLET | Freq: Two times a day (BID) | ORAL | 0 refills | Status: DC

## 2023-07-20 NOTE — Telephone Encounter (Signed)
 Refills sent to pharmacy.

## 2023-07-21 DIAGNOSIS — H43393 Other vitreous opacities, bilateral: Secondary | ICD-10-CM | POA: Diagnosis not present

## 2023-07-23 ENCOUNTER — Encounter: Payer: Self-pay | Admitting: Internal Medicine

## 2023-07-23 ENCOUNTER — Ambulatory Visit (INDEPENDENT_AMBULATORY_CARE_PROVIDER_SITE_OTHER): Payer: Medicare HMO | Admitting: Internal Medicine

## 2023-07-23 VITALS — BP 128/70 | HR 64 | Ht 69.0 in | Wt 171.6 lb

## 2023-07-23 DIAGNOSIS — E538 Deficiency of other specified B group vitamins: Secondary | ICD-10-CM | POA: Diagnosis not present

## 2023-07-23 DIAGNOSIS — E559 Vitamin D deficiency, unspecified: Secondary | ICD-10-CM | POA: Diagnosis not present

## 2023-07-23 DIAGNOSIS — I482 Chronic atrial fibrillation, unspecified: Secondary | ICD-10-CM

## 2023-07-23 DIAGNOSIS — I10 Essential (primary) hypertension: Secondary | ICD-10-CM

## 2023-07-23 DIAGNOSIS — E782 Mixed hyperlipidemia: Secondary | ICD-10-CM

## 2023-07-23 DIAGNOSIS — N400 Enlarged prostate without lower urinary tract symptoms: Secondary | ICD-10-CM | POA: Diagnosis not present

## 2023-07-23 DIAGNOSIS — I428 Other cardiomyopathies: Secondary | ICD-10-CM

## 2023-07-23 NOTE — Progress Notes (Signed)
 Established Patient Office Visit  Subjective   Patient ID: Angel Peters, male    DOB: 02/19/1946  Age: 78 y.o. MRN: [id number]  Chief Complaint  Patient presents with   Care Management    Six month follow up    Angel Peters presents today for routine follow up. He was last seen in September 2024. Since his last visit he has seen urology for BPH and cardiology for chronic a-fib. Both conditions are stable and no medications were changed at these appointments. Angel Peters reports feeling well today. He is asymptomatic and has no acute concerns to discuss.   Past Medical History:  Diagnosis Date   A-fib (HCC)    Moderate MR  2D Echo showed EF of 45-50% on 05/08/2009   High cholesterol    Hypertension    Prostate cancer [facility])    Past Surgical History:  Procedure Laterality Date   CATARACT EXTRACTION Bilateral 2023   HERNIA REPAIR     Social History   Tobacco Use   Smoking status: Never  Vaping Use   Vaping status: Never Used  Substance Use Topics   Alcohol use: Yes    Comment: socially   Drug use: No   Family History  Problem Relation Age of Onset   Heart disease Father        died at age 60   Heart disease Other    No Known Allergies  Review of Systems  Constitutional:  Negative for chills and fever.  HENT:  Negative for sore throat.   Respiratory:  Negative for cough and shortness of breath.   Cardiovascular:  Negative for chest pain, palpitations and leg swelling.  Gastrointestinal:  Negative for abdominal pain, blood in stool, constipation, diarrhea, nausea and vomiting.  Genitourinary:  Positive for frequency and urgency. Negative for dysuria and hematuria.  Musculoskeletal:  Negative for myalgias.  Skin:  Negative for itching and rash.  Neurological:  Negative for dizziness and headaches.  Psychiatric/Behavioral:  Negative for depression and suicidal ideas.      Objective:     BP 128/70   Pulse 64   Ht 5\' 9"  (1.753 m)   Wt 171 lb 9.6 oz (77.8 kg)    SpO2 96%   BMI 25.34 kg/m  BP Readings from Last 3 Encounters:  07/23/23 128/70  03/05/23 120/72  02/20/23 113/71   Physical Exam Vitals reviewed.  Constitutional:      General: He is not in acute distress.    Appearance: Normal appearance. He is normal weight. He is not ill-appearing.  HENT:     Head: Normocephalic and atraumatic.     Right Ear: External ear normal.     Left Ear: External ear normal.     Nose: Nose normal. No congestion or rhinorrhea.     Mouth/Throat:     Mouth: Mucous membranes are moist.     Pharynx: Oropharynx is clear.  Eyes:     General: No scleral icterus.    Extraocular Movements: Extraocular movements intact.     Conjunctiva/sclera: Conjunctivae normal.     Pupils: Pupils are equal, round, and reactive to light.  Cardiovascular:     Rate and Rhythm: Normal rate. Rhythm irregular.     Pulses: Normal pulses.     Heart sounds: Normal heart sounds. No murmur heard. Pulmonary:     Effort: Pulmonary effort is normal.     Breath sounds: Normal breath sounds. No wheezing, rhonchi or rales.  Abdominal:     General:  Abdomen is flat. Bowel sounds are normal. There is no distension.     Palpations: Abdomen is soft.     Tenderness: There is no abdominal tenderness.  Musculoskeletal:        General: No swelling or deformity. Normal range of motion.     Cervical back: Normal range of motion.  Skin:    General: Skin is warm and dry.     Capillary Refill: Capillary refill takes less than 2 seconds.  Neurological:     General: No focal deficit present.     Mental Status: He is alert and oriented to person, place, and time.     Motor: No weakness.  Psychiatric:        Mood and Affect: Mood normal.        Behavior: Behavior normal.        Thought Content: Thought content normal.   Last CBC Lab Results  Component Value Date   WBC 5.0 07/23/2022   HGB 15.5 07/23/2022   HCT 48.8 07/23/2022   MCV 95 07/23/2022   MCH 30.2 07/23/2022   RDW 13.0  07/23/2022   PLT 115 (L) 07/23/2022   Last metabolic panel Lab Results  Component Value Date   GLUCOSE 84 08/29/2022   NA 141 08/29/2022   K 4.6 08/29/2022   CL 103 08/29/2022   CO2 23 08/29/2022   BUN 19 08/29/2022   CREATININE 1.33 (H) 08/29/2022   EGFR 55 (L) 08/29/2022   CALCIUM 9.8 08/29/2022   PROT 6.3 07/23/2022   ALBUMIN 4.2 07/23/2022   LABGLOB 2.1 07/23/2022   AGRATIO 2.0 07/23/2022   BILITOT 0.7 07/23/2022   ALKPHOS 102 07/23/2022   AST 23 07/23/2022   ALT 23 07/23/2022   Last lipids Lab Results  Component Value Date   CHOL 137 07/23/2022   HDL 57 07/23/2022   LDLCALC 66 07/23/2022   TRIG 69 07/23/2022   CHOLHDL 2.4 07/23/2022   Last thyroid functions Lab Results  Component Value Date   TSH 1.220 07/23/2022   Last vitamin B12 and Folate Lab Results  Component Value Date   VITAMINB12 574 07/23/2022   The 10-year ASCVD risk score (Arnett DK, et al., 2019) is: 30.1%    Assessment & Plan:   Problem List Items Addressed This Visit       Chronic a-fib (HCC) - Primary   Irregularly irregular rate and rhythm on exam today.  Stable on Eliquis 5 mg and Toprol-XL 100 mg. Followed by cardiology. No medications changes today.       Essential hypertension   Blood pressure was mildly elevated initially, but on recheck was 128/70. He is taking Amlodipine 5 mg and Olmesartan 20 mg. No medication changes today.      BPH (benign prostatic hyperplasia)   Symptoms remain adequately controlled with alfuzosin.       Hyperlipidemia   Lipid panel from April 2024 reflects excellent control on rosuvastatin 20 mg daily.  Repeat lipid panel ordered today.      Return in about 6 months (around 01/22/2024).   Angel Lade, MD

## 2023-07-23 NOTE — Assessment & Plan Note (Signed)
 Blood pressure was mildly elevated initially, but on recheck was 128/70. He is taking Amlodipine 5 mg and Olmesartan 20 mg. No medication changes today.

## 2023-07-23 NOTE — Assessment & Plan Note (Addendum)
 Lipid panel from April 2024 reflects excellent control on rosuvastatin 20 mg daily.  Repeat lipid panel ordered today.

## 2023-07-23 NOTE — Patient Instructions (Signed)
 It was a pleasure to see you today.  Thank you for giving [place] the opportunity to be involved in your care.  Below is a brief recap of your visit and next steps.  We will plan to see you again in 6 months.  Summary No medication changes today Refills provided Follow up in 6 months

## 2023-07-23 NOTE — Assessment & Plan Note (Addendum)
 Irregularly irregular rate and rhythm on exam today.  Stable on Eliquis 5 mg and Toprol-XL 100 mg. Followed by cardiology. No medications changes today.

## 2023-07-23 NOTE — Assessment & Plan Note (Addendum)
 Symptoms remain adequately controlled with alfuzosin.

## 2023-07-24 ENCOUNTER — Encounter: Payer: Self-pay | Admitting: Internal Medicine

## 2023-07-24 LAB — CMP14+EGFR
ALT: 17 IU/L (ref 0–44)
AST: 23 IU/L (ref 0–40)
Albumin: 4.5 g/dL (ref 3.8–4.8)
Alkaline Phosphatase: 84 IU/L (ref 44–121)
BUN/Creatinine Ratio: 18 (ref 10–24)
BUN: 23 mg/dL (ref 8–27)
Bilirubin Total: 0.7 mg/dL (ref 0.0–1.2)
CO2: 23 mmol/L (ref 20–29)
Calcium: 9.3 mg/dL (ref 8.6–10.2)
Chloride: 105 mmol/L (ref 96–106)
Creatinine, Ser: 1.26 mg/dL (ref 0.76–1.27)
Globulin, Total: 2.6 g/dL (ref 1.5–4.5)
Glucose: 99 mg/dL (ref 70–99)
Potassium: 4.6 mmol/L (ref 3.5–5.2)
Sodium: 142 mmol/L (ref 134–144)
Total Protein: 7.1 g/dL (ref 6.0–8.5)
eGFR: 58 mL/min/{1.73_m2} — ABNORMAL LOW (ref >59)

## 2023-07-24 LAB — VITAMIN D 25 HYDROXY (VIT D DEFICIENCY, FRACTURES): Vit D, 25-Hydroxy: 52.2 ng/mL (ref 30.0–100.0)

## 2023-07-24 LAB — CBC WITH DIFFERENTIAL/PLATELET
Basophils Absolute: 0 x10E3/uL (ref 0.0–0.2)
Basos: 1 %
EOS (ABSOLUTE): 0.1 x10E3/uL (ref 0.0–0.4)
Eos: 2 %
Hematocrit: 52.2 % — ABNORMAL HIGH (ref 37.5–51.0)
Hemoglobin: 17.4 g/dL (ref 13.0–17.7)
Immature Grans (Abs): 0 x10E3/uL (ref 0.0–0.1)
Immature Granulocytes: 0 %
Lymphocytes Absolute: 1.5 x10E3/uL (ref 0.7–3.1)
Lymphs: 26 %
MCH: 30.5 pg (ref 26.6–33.0)
MCHC: 33.3 g/dL (ref 31.5–35.7)
MCV: 91 fL (ref 79–97)
Monocytes Absolute: 0.5 x10E3/uL (ref 0.1–0.9)
Monocytes: 8 %
Neutrophils Absolute: 3.6 x10E3/uL (ref 1.4–7.0)
Neutrophils: 63 %
Platelets: 146 x10E3/uL — ABNORMAL LOW (ref 150–450)
RBC: 5.71 x10E6/uL (ref 4.14–5.80)
RDW: 12.2 % (ref 11.6–15.4)
WBC: 5.8 x10E3/uL (ref 3.4–10.8)

## 2023-07-24 LAB — TSH+FREE T4
Free T4: 1.51 ng/dL (ref 0.82–1.77)
TSH: 2.15 u[IU]/mL (ref 0.450–4.500)

## 2023-07-24 LAB — LIPID PANEL
Chol/HDL Ratio: 2.6 ratio (ref 0.0–5.0)
Cholesterol, Total: 155 mg/dL (ref 100–199)
HDL: 60 mg/dL (ref >39)
LDL Chol Calc (NIH): 82 mg/dL (ref 0–99)
Triglycerides: 68 mg/dL (ref 0–149)
VLDL Cholesterol Cal: 13 mg/dL (ref 5–40)

## 2023-07-24 LAB — HEMOGLOBIN A1C
Est. average glucose Bld gHb Est-mCnc: 117 mg/dL
Hgb A1c MFr Bld: 5.7 % — ABNORMAL HIGH (ref 4.8–5.6)

## 2023-07-24 LAB — B12 AND FOLATE PANEL
Folate: >20 ng/mL (ref >3.0)
Vitamin B-12: 660 pg/mL (ref 232–1245)

## 2023-08-14 ENCOUNTER — Other Ambulatory Visit: Payer: Medicare HMO

## 2023-08-14 DIAGNOSIS — C61 Malignant neoplasm of prostate: Secondary | ICD-10-CM | POA: Diagnosis not present

## 2023-08-15 LAB — PSA, TOTAL AND FREE
PSA, Free Pct: 32.1 %
PSA, Free: 1.86 ng/mL
Prostate Specific Ag, Serum: 5.8 ng/mL — ABNORMAL HIGH (ref 0.0–4.0)

## 2023-08-21 ENCOUNTER — Encounter: Payer: Self-pay | Admitting: Urology

## 2023-08-21 ENCOUNTER — Ambulatory Visit: Payer: Medicare HMO | Admitting: Urology

## 2023-08-21 VITALS — BP 123/69 | HR 66

## 2023-08-21 DIAGNOSIS — R350 Frequency of micturition: Secondary | ICD-10-CM | POA: Diagnosis not present

## 2023-08-21 DIAGNOSIS — C61 Malignant neoplasm of prostate: Secondary | ICD-10-CM | POA: Diagnosis not present

## 2023-08-21 DIAGNOSIS — R3915 Urgency of urination: Secondary | ICD-10-CM | POA: Diagnosis not present

## 2023-08-21 DIAGNOSIS — N401 Enlarged prostate with lower urinary tract symptoms: Secondary | ICD-10-CM

## 2023-08-21 LAB — MICROSCOPIC EXAMINATION: Bacteria, UA: NONE SEEN

## 2023-08-21 LAB — URINALYSIS, ROUTINE W REFLEX MICROSCOPIC
Bilirubin, UA: NEGATIVE
Glucose, UA: NEGATIVE
Ketones, UA: NEGATIVE
Nitrite, UA: NEGATIVE
Specific Gravity, UA: 1.025 (ref 1.005–1.030)
Urobilinogen, Ur: 1 mg/dL (ref 0.2–1.0)
pH, UA: 6 (ref 5.0–7.5)

## 2023-08-21 MED ORDER — GEMTESA 75 MG PO TABS: 1.0000 | ORAL_TABLET | Freq: Every day | ORAL | Status: DC

## 2023-08-21 NOTE — Patient Instructions (Signed)

## 2023-08-21 NOTE — Progress Notes (Signed)
 post void residual=2

## 2023-08-21 NOTE — Progress Notes (Signed)
 08/21/2023 9:21 AM   Angel Peters [birth year] [id number]  Referring provider: Tobi Fortes, MD [address] [place],  [place] [address]  Prostate cancer   HPI: Mr Angel Peters is a 77yo here for followup for prostate cancer and BPH and urinary urgency. PSA increased to 5.8. No worsening LUTS. IPSS 18 QOL 3 on uroxatral  10mg  at bedtime. He is bothered by urinary frequency and urinary urgency. Nocturia 4-5x which are small amount. He denies straining to urinate. Urine stream fair   PMH: Past Medical History:  Diagnosis Date   A-fib (HCC)    Moderate MR  2D Echo showed EF of 45-50% on 05/08/2009   High cholesterol    Hypertension    Prostate cancer [facility])     Surgical History: Past Surgical History:  Procedure Laterality Date   CATARACT EXTRACTION Bilateral 2023   HERNIA REPAIR      Home Medications:  Allergies as of 08/21/2023   No Known Allergies      Medication List        Accurate as of Aug 21, 2023  9:21 AM. If you have any questions, ask your nurse or doctor.          alfuzosin  10 MG 24 hr tablet Commonly known as: UROXATRAL  TAKE 1 TABLET AT BEDTIME   amLODipine  5 MG tablet Commonly known as: NORVASC  Take 1 tablet (5 mg total) by mouth daily.   apixaban  5 MG Tabs tablet Commonly known as: Eliquis  Take 1 tablet by mouth twice daily, need MD appointment for further refills   Coenzyme Q10 100 MG capsule Take 100 mg by mouth daily.   metoprolol  succinate 100 MG 24 hr tablet Commonly known as: TOPROL -XL Take 1 tablet (100 mg total) by mouth 2 (two) times daily. . Take with or immediately following a meal.   multivitamin tablet Take 1 tablet by mouth daily.   olmesartan  20 MG tablet Commonly known as: BENICAR  TAKE 1 TABLET BY MOUTH DAILY.   Prevnar 20 0.5 ML injection Generic drug: pneumococcal 20-valent conjugate vaccine   rosuvastatin  20 MG tablet Commonly known as: CRESTOR  Take 1 tablet (20 mg total) by mouth daily.   Spikevax  syringe Generic drug: COVID-19 mRNA vaccine   Turmeric 500 MG Tabs Take by mouth. Daily        Allergies: No Known Allergies  Family History: Family History  Problem Relation Age of Onset   Heart disease Father        died at age 31   Heart disease Other     Social History:  reports that he has never smoked. He does not have any smokeless tobacco history on file. He reports current alcohol use. He reports that he does not use drugs.  ROS: All other review of systems were reviewed and are negative except what is noted above in HPI  Physical Exam: BP 123/69   Pulse 66   Constitutional:  Alert and oriented, No acute distress. HEENT: [place] AT, moist mucus membranes.  Trachea midline, no masses. Cardiovascular: No clubbing, cyanosis, or edema. Respiratory: Normal respiratory effort, no increased work of breathing. GI: Abdomen is soft, nontender, nondistended, no abdominal masses GU: No CVA tenderness.  Lymph: No cervical or inguinal lymphadenopathy. Skin: No rashes, bruises or suspicious lesions. Neurologic: Grossly intact, no focal deficits, moving all 4 extremities. Psychiatric: Normal mood and affect.  Laboratory Data: Lab Results  Component Value Date   WBC 5.8 07/23/2023   HGB 17.4 07/23/2023  HCT 52.2 (H) 07/23/2023   MCV 91 07/23/2023   PLT 146 (L) 07/23/2023    Lab Results  Component Value Date   CREATININE 1.26 07/23/2023    No results found for: "PSA"  No results found for: "TESTOSTERONE"  Lab Results  Component Value Date   HGBA1C 5.7 (H) 07/23/2023    Urinalysis    Component Value Date/Time   APPEARANCEUR Clear 02/20/2023 0920   GLUCOSEU Negative 02/20/2023 0920   BILIRUBINUR Negative 02/20/2023 0920   PROTEINUR 2+ (A) 02/20/2023 0920   NITRITE Negative 02/20/2023 0920   LEUKOCYTESUR Negative 02/20/2023 0920    Lab Results  Component Value Date   LABMICR See below: 02/20/2023   WBCUA 0-5 02/20/2023   LABEPIT 0-10 02/20/2023    BACTERIA None seen 02/20/2023    Pertinent Imaging:  No results found for this or any previous visit.  No results found for this or any previous visit.  No results found for this or any previous visit.  No results found for this or any previous visit.  No results found for this or any previous visit.  No results found for this or any previous visit.  No results found for this or any previous visit.  No results found for this or any previous visit.   Assessment & Plan:    1. Prostate cancer (HCC) (Primary) Prostate MRI - Urinalysis, Routine w reflex microscopic - BLADDER SCAN AMB NON-IMAGING  2. Benign prostatic hyperplasia with urinary frequency Continue uroxatral  10mg  qhs  3. Urinary urgency We will trial gemtesa  75mg  daily   No follow-ups on file.  Angel Nailer, MD  [facility] [place]

## 2023-08-28 ENCOUNTER — Ambulatory Visit (HOSPITAL_COMMUNITY)
Admission: RE | Admit: 2023-08-28 | Discharge: 2023-08-28 | Disposition: A | Discharge status: Home / Self Care | Source: Ambulatory Visit | Attending: Urology | Admitting: Urology

## 2023-08-28 DIAGNOSIS — K5730 Diverticulosis of large intestine without perforation or abscess without bleeding: Secondary | ICD-10-CM | POA: Diagnosis not present

## 2023-08-28 DIAGNOSIS — N400 Enlarged prostate without lower urinary tract symptoms: Secondary | ICD-10-CM | POA: Diagnosis not present

## 2023-08-28 DIAGNOSIS — C61 Malignant neoplasm of prostate: Secondary | ICD-10-CM | POA: Insufficient documentation

## 2023-08-28 MED ORDER — GADOBUTROL 1 MMOL/ML IV SOLN
8.0000 mL | Freq: Once | INTRAVENOUS | Status: AC | PRN
  Administered 2023-08-28: 8 mL via INTRAVENOUS

## 2023-09-02 ENCOUNTER — Ambulatory Visit: Payer: Self-pay

## 2023-10-01 ENCOUNTER — Other Ambulatory Visit: Payer: Self-pay | Admitting: Internal Medicine

## 2023-10-06 ENCOUNTER — Ambulatory Visit (INDEPENDENT_AMBULATORY_CARE_PROVIDER_SITE_OTHER): Payer: Medicare HMO

## 2023-10-06 VITALS — BP 120/75 | Ht 69.0 in | Wt 170.0 lb

## 2023-10-06 DIAGNOSIS — Z0000 Encounter for general adult medical examination without abnormal findings: Secondary | ICD-10-CM

## 2023-10-06 NOTE — Patient Instructions (Signed)
 Mr. Angel Peters , Thank you for taking time out of your busy schedule to complete your Annual Wellness Visit with me. I enjoyed our conversation and look forward to speaking with you again next year. I, as well as your care team,  appreciate your ongoing commitment to your health goals. Please review the following plan we discussed and let me know if I can assist you in the future. Your Game plan/ To Do List    Referrals: If you haven't heard from the office you've been referred to, please reach out to them at the phone provided.  I will request your last colonscopy report from [place]   Please email a copy of your Advanced Healthcare Directives,such as your Healthcare Power of [place], Living Will or DNR status to the following secure email: [email or web address]  Follow up Visits: Next Medicare AWV with our clinical staff: October 10, 2024 at 9:20   Have you seen your provider in the last 6 months (3 months if uncontrolled diabetes)? Yes Next Office Visit with your provider: January 22, 2024 at 9:40 in office with Rice Chamorro  Clinician Recommendations:  Aim for 30 minutes of exercise or brisk walking, 6-8 glasses of water, and 5 servings of fruits and vegetables each day.       This is a list of the screening recommended for you and due dates:  Health Maintenance  Topic Date Due   COVID-19 Vaccine (9 - 2024-25 season) 12/21/2022   Medicare Annual Wellness Visit  09/24/2023   Flu Shot  11/20/2023   DTaP/Tdap/Td vaccine (4 - Td or Tdap) 03/21/2026   Pneumococcal Vaccine for age over 33  Completed   Hepatitis C Screening  Completed   Zoster (Shingles) Vaccine  Completed   HPV Vaccine  Aged Out   Meningitis B Vaccine  Aged Out    Advanced directives: (Copy Requested) Please bring a copy of your health care power of attorney and living will to the office to be added to your chart at your convenience. You can mail to [facility] [address] [place], [place] [address] or email  to [email or web address] Advance Care Planning is important because it:  [x]  Makes sure you receive the medical care that is consistent with your values, goals, and preferences  [x]  It provides guidance to your family and loved ones and reduces their decisional burden about whether or not they are making the right decisions based on your wishes.  Follow the link provided in your after visit summary or read over the paperwork we have mailed to you to help you started getting your Advance Directives in place. If you need assistance in completing these, please reach out to us  so that we can help you!  Understanding Your Risk for Falls Millions of people have serious injuries from falls each year. It is important to understand your risk of falling. Talk with your health care provider about your risk and what you can do to lower it. If you do have a serious fall, make sure to tell your provider. Falling once raises your risk of falling again. How can falls affect me? Serious injuries from falls are common. These include: Broken bones, such as hip fractures. Head injuries, such as traumatic brain injuries (TBI) or concussions. A fear of falling can cause you to avoid activities and stay at home. This can make your muscles weaker and raise your risk for a fall. What can increase my risk? There are a number of risk factors that increase  your risk for falling. The more risk factors you have, the higher your risk of falling. Serious injuries from a fall happen most often to people who are older than 79 years old. Teenagers and young adults ages 94-29 are also at higher risk. Common risk factors include: Weakness in the lower body. Being generally weak or confused due to long-term (chronic) illness. Dizziness or balance problems. Poor vision. Medicines that cause dizziness or drowsiness. These may include: Medicines for your blood pressure, heart, anxiety, insomnia, or swelling (edema). Pain  medicines. Muscle relaxants. Other risk factors include: Drinking alcohol. Having had a fall in the past. Having foot pain or wearing improper footwear. Working at a dangerous job. Having any of the following in your home: Tripping hazards, such as floor clutter or loose rugs. Poor lighting. Pets. Having dementia or memory loss. What actions can I take to lower my risk of falling?     Physical activity Stay physically fit. Do strength and balance exercises. Consider taking a regular class to build strength and balance. Yoga and tai chi are good options. Vision Have your eyes checked every year and your prescription for glasses or contacts updated as needed. Shoes and walking aids Wear non-skid shoes. Wear shoes that have rubber soles and low heels. Do not wear high heels. Do not walk around the house in socks or slippers. Use a cane or walker as told by your provider. Home safety Attach secure railings on both sides of your stairs. Install grab bars for your bathtub, shower, and toilet. Use a non-skid mat in your bathtub or shower. Attach bath mats securely with double-sided, non-slip rug tape. Use good lighting in all rooms. Keep a flashlight near your bed. Make sure there is a clear path from your bed to the bathroom. Use night-lights. Do not use throw rugs. Make sure all carpeting is taped or tacked down securely. Remove all clutter from walkways and stairways, including extension cords. Repair uneven or broken steps and floors. Avoid walking on icy or slippery surfaces. Walk on the grass instead of on icy or slick sidewalks. Use ice melter to get rid of ice on walkways in the winter. Use a cordless phone. Questions to ask your health care provider Can you help me check my risk for a fall? Do any of my medicines make me more likely to fall? Should I take a vitamin D  supplement? What exercises can I do to improve my strength and balance? Should I make an appointment to have  my vision checked? Do I need a bone density test to check for weak bones (osteoporosis)? Would it help to use a cane or a walker? Where to find more information Centers for Disease Control and Prevention, STEADI: TonerPromos.no Community-Based Fall Prevention Programs: TonerPromos.no [employer] on Aging: BaseRingTones.pl Contact a health care provider if: You fall at home. You are afraid of falling at home. You feel weak, drowsy, or dizzy. This information is not intended to replace advice given to you by your health care provider. Make sure you discuss any questions you have with your health care provider. Document Revised: 12/09/2021 Document Reviewed: 12/09/2021 Elsevier Patient Education  2024 [employer].

## 2023-10-06 NOTE — Progress Notes (Signed)
 Subjective:   Angel Peters is a 78 y.o. who presents for a Medicare Wellness preventive visit.  As a reminder, Annual Wellness Visits don't include a physical exam, and some assessments may be limited, especially if this visit is performed virtually. We may recommend an in-person follow-up visit with your provider if needed.  Visit Complete: Virtual I connected with  Angel Peters on 10/06/23 by a video and audio enabled telemedicine application and verified that I am speaking with the correct person using two identifiers.  Patient Location: Home  Provider Location: Home Office  I discussed the limitations of evaluation and management by telemedicine. The patient expressed understanding and agreed to proceed.  Vital Signs: Because this visit was a virtual/telehealth visit, some criteria may be missing or patient reported. Any vitals not documented were not able to be obtained and vitals that have been documented are patient reported.  Persons Participating in Visit: Patient.  AWV Questionnaire: Yes: Patient Medicare AWV questionnaire was completed by the patient on 10/02/2023; I have confirmed that all information answered by patient is correct and no changes since this date.  Cardiac Risk Factors include: advanced age (>79men, >54 women);hypertension;male gender;dyslipidemia;Other (see comment), Risk factor comments: AFib     Objective:    Today's Vitals   10/06/23 1135  BP: 120/75  Weight: 170 lb (77.1 kg)  Height: 5' 9 (1.753 m)   Body mass index is 25.1 kg/m.     10/06/2023   11:36 AM 09/24/2022   10:03 AM  Advanced Directives  Does Patient Have a Medical Advance Directive? Yes No  Type of Estate agent of [place];Living will   Copy of Healthcare Power of Attorney in Chart? No - copy requested   Would patient like information on creating a medical advance directive?  No - Patient declined    Current Medications (verified) Outpatient Encounter  Medications as of 10/06/2023  Medication Sig   alfuzosin  (UROXATRAL ) 10 MG 24 hr tablet TAKE 1 TABLET AT BEDTIME   amLODipine  (NORVASC ) 5 MG tablet TAKE 1 TABLET EVERY DAY   apixaban  (ELIQUIS ) 5 MG TABS tablet Take 1 tablet by mouth twice daily, need MD appointment for further refills   Coenzyme Q10 100 MG capsule Take 100 mg by mouth daily.   metoprolol  succinate (TOPROL -XL) 100 MG 24 hr tablet TAKE 1 TABLET TWICE DAILY TAKE WITH OR IMMEDIATELY FOLLOWING A MEAL.   Multiple Vitamin (MULTIVITAMIN) tablet Take 1 tablet by mouth daily.   olmesartan  (BENICAR ) 20 MG tablet TAKE 1 TABLET BY MOUTH DAILY.   PREVNAR 20 0.5 ML injection    rosuvastatin  (CRESTOR ) 20 MG tablet Take 1 tablet (20 mg total) by mouth daily.   SPIKEVAX syringe    Turmeric 500 MG TABS Take by mouth. Daily   Vibegron  (GEMTESA ) 75 MG TABS Take 1 tablet (75 mg total) by mouth daily.   No facility-administered encounter medications on file as of 10/06/2023.    Allergies (verified) Patient has no known allergies.   History: Past Medical History:  Diagnosis Date   A-fib (HCC)    Moderate MR  2D Echo showed EF of 45-50% on 05/08/2009   Arrhythmia    High cholesterol    Hypertension    Prostate cancer [facility])    Past Surgical History:  Procedure Laterality Date   CATARACT EXTRACTION Bilateral 2023   HERNIA REPAIR     Family History  Problem Relation Age of Onset   Arthritis Mother    Miscarriages /  Stillbirths Mother    Heart disease Father        died at age 30   Early death Father    Heart disease Other    Heart disease Paternal Grandfather    Arthritis Paternal Grandmother    Cancer Paternal Grandmother    Social History   Socioeconomic History   Marital status: Married    Spouse name: Not on file   Number of children: 2   Years of education: Not on file   Highest education level: Bachelor's degree (e.g., BA, AB, BS)  Occupational History   Not on file  Tobacco Use   Smoking status: Never   Smokeless  tobacco: Not on file  Vaping Use   Vaping status: Never Used  Substance and Sexual Activity   Alcohol use: Yes    Alcohol/week: 14.0 standard drinks of alcohol    Types: 14 Standard drinks or equivalent per week    Comment: socially   Drug use: No   Sexual activity: Not Currently    Birth control/protection: None  Other Topics Concern   Not on file  Social History Narrative   Patient lives with his wife and recently moved back to [place] from [place] .    Patient and wife are very active in yard and home and travel often   Social Drivers of Corporate investment banker Strain: Low Risk  (10/02/2023)   Overall Financial Resource Strain (CARDIA)    Difficulty of Paying Living Expenses: Not hard at all  Food Insecurity: No Food Insecurity (10/02/2023)   Hunger Vital Sign    Worried About Running Out of Food in the Last Year: Never true    Ran Out of Food in the Last Year: Never true  Transportation Needs: No Transportation Needs (10/02/2023)   PRAPARE - Administrator, Civil Service (Medical): No    Lack of Transportation (Non-Medical): No  Physical Activity: Insufficiently Active (10/02/2023)   Exercise Vital Sign    Days of Exercise per Week: 4 days    Minutes of Exercise per Session: 30 min  Stress: No Stress Concern Present (10/02/2023)   [employer] of Occupational Health - Occupational Stress Questionnaire    Feeling of Stress: Not at all  Social Connections: Socially Integrated (10/02/2023)   Social Connection and Isolation Panel    Frequency of Communication with Friends and Family: Once a week    Frequency of Social Gatherings with Friends and Family: Twice a week    Attends Religious Services: 1 to 4 times per year    Active Member of [employer] or Organizations: Yes    Attends Engineer, structural: More than 4 times per year    Marital Status: Married    Tobacco Counseling Counseling given: Yes    Clinical Intake:  Pre-visit preparation completed:  Yes  Pain : No/denies pain     BMI - recorded: 25.1 Nutritional Status: BMI 25 -29 Overweight Nutritional Risks: None Diabetes: No  Lab Results  Component Value Date   HGBA1C 5.7 (H) 07/23/2023     How often do you need to have someone help you when you read instructions, pamphlets, or other written materials from your doctor or pharmacy?: 1 - Never  Interpreter Needed?: No  Information entered by :: Sally Crazier CMA   Activities of Daily Living     10/06/2023   11:42 AM 10/02/2023   11:51 AM  In your present state of health, do you have any difficulty performing the  following activities:  Hearing? 0 0  Vision? 0 0  Difficulty concentrating or making decisions? 0 0  Walking or climbing stairs? 0 0  Dressing or bathing? 0 0  Doing errands, shopping? 0 0  Preparing Food and eating ? N N  Using the Toilet? N N  In the past six months, have you accidently leaked urine? N Y  Do you have problems with loss of bowel control? N N  Managing your Medications? N N  Managing your Finances? N N  Housekeeping or managing your Housekeeping? N N    Patient Care Team: Tobi Fortes, MD as PCP - General (Internal Medicine) Amanda Jungling Joyceann No, MD as Consulting Physician (Cardiology) Claretta Croft Arden Beck, MD as Consulting Physician (Urology) Lucendia Rusk, DO as Consulting Physician (Optometry) [facility]  as Consulting Physician (Optometry) Augustin Bloch, OD as Consulting Physician (Optometry)  I have updated your Care Teams any recent Medical Services you may have received from other providers in the past year.     Assessment:   This is a routine wellness examination for Angel Peters.  Hearing/Vision screen Hearing Screening - Comments:: Patient states his hearing isn't as sharp as it once was but doesn't need testing right now.  Vision Screening - Comments:: Patient wears reading glasses only. Up to date with yearly exams.  Patient sees Dr. Lucendia Rusk w/  [facility] office.     Goals Addressed             This Visit's Progress    Patient Stated   On track    Patient wants to increase his activity like it was when he lived in [place] .  Reviewed on 10/06/2023       Depression Screen     10/06/2023   11:41 AM 07/23/2023    9:59 AM 01/08/2023    2:50 PM 09/24/2022   10:02 AM 07/07/2022    3:29 PM  PHQ 2/9 Scores  PHQ - 2 Score 0 1 0 0 0  PHQ- 9 Score 0 1       Fall Risk     10/02/2023   11:51 AM 01/08/2023    2:50 PM 09/24/2022    9:57 AM 09/20/2022    9:46 AM 07/07/2022    3:28 PM  Fall Risk   Falls in the past year? 0 0 0 0 0  Number falls in past yr: 0 0 0 0 0  Injury with Fall? 0 0 0 0 0  Risk for fall due to :   No Fall Risks    Follow up   Falls prevention discussed      MEDICARE RISK AT HOME:  Medicare Risk at Home Any stairs in or around the home?: (Patient-Rptd) Yes If so, are there any without handrails?: (Patient-Rptd) No Home free of loose throw rugs in walkways, pet beds, electrical cords, etc?: (Patient-Rptd) Yes Adequate lighting in your home to reduce risk of falls?: (Patient-Rptd) Yes Life alert?: (Patient-Rptd) No Use of a cane, walker or w/c?: (Patient-Rptd) No Grab bars in the bathroom?: (Patient-Rptd) No Shower chair or bench in shower?: (Patient-Rptd) Yes Elevated toilet seat or a handicapped toilet?: (Patient-Rptd) No  TIMED UP AND GO:  Was the test performed?  No  Cognitive Function: 6CIT completed        10/06/2023   11:58 AM 09/24/2022   10:03 AM  6CIT Screen  What Year? 0 points 0 points  What month? 0 points 0 points  What  time? 0 points 0 points  Count back from 20 0 points 0 points  Months in reverse 0 points 0 points  Repeat phrase 0 points 0 points  Total Score 0 points 0 points    Immunizations Immunization History  Administered Date(s) Administered   DTaP 03/21/2016   Fluad Quad(high Dose 65+) 01/30/2021   Fluad Trivalent(High Dose 65+) 02/10/2017, 01/08/2023    Hepatitis A, Adult 04/10/2018, 10/11/2018   Influenza, High Dose Seasonal PF 04/22/2015, 02/10/2017, 01/22/2018   Influenza,inj,Quad PF,6+ Mos 12/28/2021   Influenza,inj,quad, With Preservative 02/08/2019   Influenza-Unspecified 01/19/2014   Moderna Covid-19 Fall Seasonal Vaccine 53yrs & older 01/23/2022   Moderna Covid-19 Vaccine Bivalent Booster 64yrs & up 01/30/2021   Moderna Sars-Covid-2 Vaccination 07/02/2019, 08/02/2019, 02/15/2020   PNEUMOCOCCAL CONJUGATE-20 03/07/2022   Pneumococcal Conjugate-13 03/22/2010, 04/27/2018   Respiratory Syncytial Virus Vaccine,Recomb Aduvanted(Arexvy) 12/28/2021   Tdap 03/22/1953, 03/21/2016   Zoster Recombinant(Shingrix) 12/21/2019, 02/20/2020   Zoster, Live 03/23/1995    Screening Tests Health Maintenance  Topic Date Due   COVID-19 Vaccine (9 - 2024-25 season) 12/21/2022   INFLUENZA VACCINE  11/20/2023   Medicare Annual Wellness (AWV)  10/05/2024   DTaP/Tdap/Td (4 - Td or Tdap) 03/21/2026   Pneumococcal Vaccine: 50+ Years  Completed   Hepatitis C Screening  Completed   Zoster Vaccines- Shingrix  Completed   HPV VACCINES  Aged Out   Meningococcal B Vaccine  Aged Out    Health Maintenance  Health Maintenance Due  Topic Date Due   COVID-19 Vaccine (9 - 2024-25 season) 12/21/2022   Health Maintenance Items Addressed: Will request last Colonoscopy report from [facility]//Dr. Delana Favors in [place]   Additional Screening:  Vision Screening: Recommended annual ophthalmology exams for early detection of glaucoma and other disorders of the eye. Would you like a referral to an eye doctor? No    Dental Screening: Recommended annual dental exams for proper oral hygiene  Community Resource Referral / Chronic Care Management: CRR required this visit?  No   CCM required this visit?  No   Plan: Last colonoscopy report will be requested from Dr. Delana Favors with [facility] in [place] . Patient  verbalizes understanding of treatment plan and is in agreement with plan. They have been provided with a copy of their treatment plan.      I have personally reviewed and noted the following in the patient's chart:   Medical and social history Use of alcohol, tobacco or illicit drugs  Current medications and supplements including opioid prescriptions. Patient is not currently taking opioid prescriptions. Functional ability and status Nutritional status Physical activity Advanced directives List of other physicians Hospitalizations, surgeries, and ER visits in previous 12 months Vitals Screenings to include cognitive, depression, and falls Referrals and appointments  In addition, I have reviewed and discussed with patient certain preventive protocols, quality metrics, and best practice recommendations. A written personalized care plan for preventive services as well as general preventive health recommendations were provided to patient.   Pistol Kessenich, CMA   10/06/2023   After Visit Summary: (MyChart) Due to this being a telephonic visit, the after visit summary with patients personalized plan was offered to patient via MyChart   Notes: Nothing significant to report at this time.

## 2023-10-12 ENCOUNTER — Other Ambulatory Visit: Payer: Self-pay

## 2023-10-12 ENCOUNTER — Telehealth: Payer: Self-pay

## 2023-10-12 ENCOUNTER — Encounter: Payer: Self-pay | Admitting: Urology

## 2023-10-12 DIAGNOSIS — N401 Enlarged prostate with lower urinary tract symptoms: Secondary | ICD-10-CM

## 2023-10-12 MED ORDER — GEMTESA 75 MG PO TABS
1.0000 | ORAL_TABLET | Freq: Every day | ORAL | 11 refills | Status: DC
Start: 2023-10-12 — End: 2024-03-02

## 2023-10-12 NOTE — Telephone Encounter (Signed)
 Called pt to send his Rx per verbal from MD [place] okay to go on gemtesa  full time pt stated he wanted his Rx sent to [facility] pharmacy in [place] confirmed

## 2023-10-14 ENCOUNTER — Ambulatory Visit: Admitting: Urology

## 2023-10-17 ENCOUNTER — Other Ambulatory Visit: Payer: Self-pay | Admitting: Internal Medicine

## 2023-10-17 DIAGNOSIS — I10 Essential (primary) hypertension: Secondary | ICD-10-CM

## 2023-10-19 ENCOUNTER — Other Ambulatory Visit: Payer: Self-pay

## 2023-10-19 DIAGNOSIS — N401 Enlarged prostate with lower urinary tract symptoms: Secondary | ICD-10-CM

## 2023-10-20 NOTE — Telephone Encounter (Signed)
 After visit summary efaxed per pt request.- Release: [id number]

## 2023-12-28 ENCOUNTER — Other Ambulatory Visit: Payer: Self-pay

## 2023-12-28 ENCOUNTER — Telehealth: Payer: Self-pay

## 2023-12-28 MED ORDER — COVID-19 MRNA VAC-TRIS(PFIZER) 30 MCG/0.3ML IM SUSY: 0.3000 mL | PREFILLED_SYRINGE | Freq: Once | INTRAMUSCULAR | 0 refills | Status: AC

## 2023-12-28 NOTE — Telephone Encounter (Signed)
 Copied from CRM [id number]. Topic: Clinical - Medication Question >> Dec 28, 2023  2:14 PM Willma R wrote: Reason for CRM: Patient is requesting a prescription for the Covid Vaccine.  [id number] - [place], [place] - [address] [place] [address] Phone: [phone] Fax: [phone]  Patient can be reached at [phone]

## 2023-12-29 NOTE — Telephone Encounter (Signed)
 Left vm.

## 2024-01-22 ENCOUNTER — Ambulatory Visit: Payer: Self-pay

## 2024-02-24 ENCOUNTER — Other Ambulatory Visit: Payer: Self-pay | Admitting: Internal Medicine

## 2024-02-24 ENCOUNTER — Other Ambulatory Visit: Payer: Self-pay | Admitting: Medical Genetics

## 2024-03-02 ENCOUNTER — Ambulatory Visit (INDEPENDENT_AMBULATORY_CARE_PROVIDER_SITE_OTHER): Payer: Self-pay

## 2024-03-02 VITALS — BP 138/94 | HR 61 | Ht 69.0 in | Wt 180.0 lb

## 2024-03-02 DIAGNOSIS — I10 Essential (primary) hypertension: Secondary | ICD-10-CM

## 2024-03-02 DIAGNOSIS — N400 Enlarged prostate without lower urinary tract symptoms: Secondary | ICD-10-CM | POA: Diagnosis not present

## 2024-03-02 DIAGNOSIS — I428 Other cardiomyopathies: Secondary | ICD-10-CM

## 2024-03-02 DIAGNOSIS — E782 Mixed hyperlipidemia: Secondary | ICD-10-CM

## 2024-03-02 MED ORDER — COVID-19 MRNA VAC-TRIS(PFIZER) 30 MCG/0.3ML IM SUSY: 0.3000 mL | PREFILLED_SYRINGE | Freq: Once | INTRAMUSCULAR | 0 refills | Status: AC

## 2024-03-02 NOTE — Progress Notes (Signed)
 Established Patient Office Visit  Subjective   Patient ID: Angel Peters, male    DOB: 1945/06/02  Age: 78 y.o. MRN: [id number]  Chief Complaint  Patient presents with   Medical Management of Chronic Issues    6 month follow up    HPI Discussed the use of AI scribe software for clinical note transcription with the patient, who gave verbal consent to proceed.  History of Present Illness    Angel Peters is a 78 year old male who presents for a six-month follow-up visit.  General health status - Feels well without specific concerns - Able to perform daily activities - Feels he is doing well for his age  Immunization status - Received influenza vaccination - Plans to receive COVID-19 vaccination next week, which may have already been arranged by his wife  Blood pressure monitoring - Monitors blood pressure at home - Home blood pressure readings usually in the high 120s/ high 70s mmHg - Home readings are typically lower than office measurements  Medication management - Currently taking Eliquis  and metoprolol , managed by his cardiologist - Up to date on medication refills - No need for additional prescriptions at this time  Pharmacy services - Dissatisfied with service at [facility] on [address] due to long wait times and staffing issues - Considering switching to a different pharmacy for prescriptions     Patient Active Problem List   Diagnosis Date Noted   BPH (benign prostatic hyperplasia) 01/14/2023   Need for influenza vaccination 01/14/2023   B12 deficiency 07/07/2022   Longstanding persistent atrial fibrillation (HCC) 07/07/2022   Abnormal prostate biopsy 07/07/2022   Non-ischemic cardiomyopathy (HCC) 09/05/2013   Chronic a-fib (HCC) 09/03/2012   Essential hypertension 09/03/2012   Hyperlipidemia 09/03/2012    ROS    Objective:     BP (!) 138/94   Pulse 61   Ht 5' 9 (1.753 m)   Wt 180 lb 0.6 oz (81.7 kg)   SpO2 97%   BMI 26.59 kg/m   BP Readings from Last 3 Encounters:  03/02/24 (!) 138/94  10/06/23 120/75  08/21/23 123/69   Wt Readings from Last 3 Encounters:  03/02/24 180 lb 0.6 oz (81.7 kg)  10/06/23 170 lb (77.1 kg)  07/23/23 171 lb 9.6 oz (77.8 kg)     Physical Exam Vitals and nursing note reviewed.  Constitutional:      Appearance: Normal appearance.  HENT:     Head: Normocephalic.  Eyes:     Extraocular Movements: Extraocular movements intact.     Pupils: Pupils are equal, round, and reactive to light.  Cardiovascular:     Rate and Rhythm: Normal rate and regular rhythm.  Pulmonary:     Effort: Pulmonary effort is normal.     Breath sounds: Normal breath sounds.  Musculoskeletal:     Cervical back: Normal range of motion and neck supple.  Neurological:     Mental Status: He is alert and oriented to person, place, and time.  Psychiatric:        Mood and Affect: Mood normal.        Thought Content: Thought content normal.        Last CBC Lab Results  Component Value Date   WBC 6.7 03/02/2024   HGB 17.1 03/02/2024   HCT 51.7 (H) 03/02/2024   MCV 94 03/02/2024   MCH 31.1 03/02/2024   RDW 12.0 03/02/2024   PLT 146 (L) 03/02/2024   Last metabolic panel Lab Results  Component Value Date   GLUCOSE 94 03/02/2024   NA 141 03/02/2024   K 5.0 03/02/2024   CL 105 03/02/2024   CO2 21 03/02/2024   BUN 20 03/02/2024   CREATININE 1.29 (H) 03/02/2024   EGFR 57 (L) 03/02/2024   CALCIUM  9.4 03/02/2024   PROT 7.2 03/02/2024   ALBUMIN 4.6 03/02/2024   LABGLOB 2.6 03/02/2024   AGRATIO 2.0 07/23/2022   BILITOT 0.8 03/02/2024   ALKPHOS 73 03/02/2024   AST 24 03/02/2024   ALT 14 03/02/2024   Last lipids Lab Results  Component Value Date   CHOL 161 03/02/2024   HDL 56 03/02/2024   LDLCALC 87 03/02/2024   TRIG 98 03/02/2024   CHOLHDL 2.9 03/02/2024   Last hemoglobin A1c Lab Results  Component Value Date   HGBA1C 5.7 (H) 03/02/2024   Last thyroid  functions Lab Results  Component  Value Date   TSH 2.400 03/02/2024   FREET4 1.48 03/02/2024   Last vitamin D  Lab Results  Component Value Date   VD25OH 52.2 07/23/2023   Last vitamin B12 and Folate Lab Results  Component Value Date   VITAMINB12 660 07/23/2023   FOLATE >20.0 07/23/2023     The 10-year ASCVD risk score (Arnett DK, et al., 2019) is: 34.9%    Assessment & Plan:   Problem List Items Addressed This Visit       Cardiovascular and Mediastinum   Essential hypertension - Primary   Blood pressure was elevated today, but states that he ha ats not had medication today.  He is taking Amlodipine  5 mg and Olmesartan  20 mg. No medication changes today.      Relevant Orders   TSH + free T4 (Completed)   Hemoglobin A1c (Completed)   CMP14+EGFR (Completed)     Genitourinary   BPH (benign prostatic hyperplasia)   Symptoms remain adequately controlled with alfuzosin .       Relevant Orders   CMP14+EGFR (Completed)   CBC with Differential/Platelet (Completed)     Other   Hyperlipidemia   Currently on rosuvastatin  20 mg daily.  Repeat lipid panel ordered today.       Relevant Orders   Hemoglobin A1c (Completed)   Lipid panel (Completed)    No follow-ups on file.    Leita Longs, FNP

## 2024-03-03 DIAGNOSIS — E785 Hyperlipidemia, unspecified: Secondary | ICD-10-CM | POA: Diagnosis not present

## 2024-03-03 DIAGNOSIS — E1122 Type 2 diabetes mellitus with diabetic chronic kidney disease: Secondary | ICD-10-CM | POA: Diagnosis not present

## 2024-03-03 DIAGNOSIS — N400 Enlarged prostate without lower urinary tract symptoms: Secondary | ICD-10-CM | POA: Diagnosis not present

## 2024-03-03 DIAGNOSIS — D599 Acquired hemolytic anemia, unspecified: Secondary | ICD-10-CM | POA: Diagnosis not present

## 2024-03-03 DIAGNOSIS — I129 Hypertensive chronic kidney disease with stage 1 through stage 4 chronic kidney disease, or unspecified chronic kidney disease: Secondary | ICD-10-CM | POA: Diagnosis not present

## 2024-03-03 DIAGNOSIS — I429 Cardiomyopathy, unspecified: Secondary | ICD-10-CM | POA: Diagnosis not present

## 2024-03-03 DIAGNOSIS — E72539 Primary hyperoxaluria, unspecified: Secondary | ICD-10-CM | POA: Diagnosis not present

## 2024-03-03 DIAGNOSIS — I4891 Unspecified atrial fibrillation: Secondary | ICD-10-CM | POA: Diagnosis not present

## 2024-03-03 DIAGNOSIS — M1990 Unspecified osteoarthritis, unspecified site: Secondary | ICD-10-CM | POA: Diagnosis not present

## 2024-03-03 DIAGNOSIS — N1831 Chronic kidney disease, stage 3a: Secondary | ICD-10-CM | POA: Diagnosis not present

## 2024-03-03 DIAGNOSIS — I700 Atherosclerosis of aorta: Secondary | ICD-10-CM | POA: Diagnosis not present

## 2024-03-03 LAB — CBC WITH DIFFERENTIAL/PLATELET
Basophils Absolute: 0.1 x10E3/uL (ref 0.0–0.2)
Basos: 1 %
EOS (ABSOLUTE): 0.1 x10E3/uL (ref 0.0–0.4)
Eos: 2 %
Hematocrit: 51.7 % — ABNORMAL HIGH (ref 37.5–51.0)
Hemoglobin: 17.1 g/dL (ref 13.0–17.7)
Immature Grans (Abs): 0 x10E3/uL (ref 0.0–0.1)
Immature Granulocytes: 0 %
Lymphocytes Absolute: 1.7 x10E3/uL (ref 0.7–3.1)
Lymphs: 25 %
MCH: 31.1 pg (ref 26.6–33.0)
MCHC: 33.1 g/dL (ref 31.5–35.7)
MCV: 94 fL (ref 79–97)
Monocytes Absolute: 0.6 x10E3/uL (ref 0.1–0.9)
Monocytes: 9 %
Neutrophils Absolute: 4.3 x10E3/uL (ref 1.4–7.0)
Neutrophils: 63 %
Platelets: 146 x10E3/uL — ABNORMAL LOW (ref 150–450)
RBC: 5.49 x10E6/uL (ref 4.14–5.80)
RDW: 12 % (ref 11.6–15.4)
WBC: 6.7 x10E3/uL (ref 3.4–10.8)

## 2024-03-03 LAB — TSH+FREE T4
Free T4: 1.48 ng/dL (ref 0.82–1.77)
TSH: 2.4 u[IU]/mL (ref 0.450–4.500)

## 2024-03-03 LAB — CMP14+EGFR
ALT: 14 IU/L (ref 0–44)
AST: 24 IU/L (ref 0–40)
Albumin: 4.6 g/dL (ref 3.8–4.8)
Alkaline Phosphatase: 73 IU/L (ref 47–123)
BUN/Creatinine Ratio: 16 (ref 10–24)
BUN: 20 mg/dL (ref 8–27)
Bilirubin Total: 0.8 mg/dL (ref 0.0–1.2)
CO2: 21 mmol/L (ref 20–29)
Calcium: 9.4 mg/dL (ref 8.6–10.2)
Chloride: 105 mmol/L (ref 96–106)
Creatinine, Ser: 1.29 mg/dL — ABNORMAL HIGH (ref 0.76–1.27)
Globulin, Total: 2.6 g/dL (ref 1.5–4.5)
Glucose: 94 mg/dL (ref 70–99)
Potassium: 5 mmol/L (ref 3.5–5.2)
Sodium: 141 mmol/L (ref 134–144)
Total Protein: 7.2 g/dL (ref 6.0–8.5)
eGFR: 57 mL/min/1.73 — ABNORMAL LOW (ref >59)

## 2024-03-03 LAB — LIPID PANEL
Chol/HDL Ratio: 2.9 ratio (ref 0.0–5.0)
Cholesterol, Total: 161 mg/dL (ref 100–199)
HDL: 56 mg/dL (ref >39)
LDL Chol Calc (NIH): 87 mg/dL (ref 0–99)
Triglycerides: 98 mg/dL (ref 0–149)
VLDL Cholesterol Cal: 18 mg/dL (ref 5–40)

## 2024-03-03 LAB — HEMOGLOBIN A1C
Est. average glucose Bld gHb Est-mCnc: 117 mg/dL
Hgb A1c MFr Bld: 5.7 % — ABNORMAL HIGH (ref 4.8–5.6)

## 2024-03-07 ENCOUNTER — Ambulatory Visit: Payer: Self-pay

## 2024-03-07 NOTE — Assessment & Plan Note (Signed)
 Currently on rosuvastatin  20 mg daily.  Repeat lipid panel ordered today.

## 2024-03-07 NOTE — Assessment & Plan Note (Signed)
 Blood pressure was elevated today, but states that he ha ats not had medication today.  He is taking Amlodipine  5 mg and Olmesartan  20 mg. No medication changes today.

## 2024-03-07 NOTE — Assessment & Plan Note (Signed)
 Symptoms remain adequately controlled with alfuzosin.

## 2024-03-09 ENCOUNTER — Other Ambulatory Visit (HOSPITAL_COMMUNITY)
Admission: RE | Admit: 2024-03-09 | Discharge: 2024-03-09 | Disposition: A | Discharge status: Home / Self Care | Payer: Self-pay | Source: Ambulatory Visit | Attending: Medical Genetics | Admitting: Medical Genetics

## 2024-03-21 LAB — GENECONNECT MOLECULAR SCREEN: Genetic Analysis Overall Interpretation: NEGATIVE

## 2024-04-06 ENCOUNTER — Other Ambulatory Visit: Payer: Self-pay | Admitting: Urology

## 2024-04-12 ENCOUNTER — Other Ambulatory Visit

## 2024-04-12 ENCOUNTER — Other Ambulatory Visit: Payer: Self-pay

## 2024-04-12 DIAGNOSIS — C61 Malignant neoplasm of prostate: Secondary | ICD-10-CM

## 2024-04-13 LAB — PSA: Prostate Specific Ag, Serum: 6 ng/mL — ABNORMAL HIGH (ref 0.0–4.0)

## 2024-04-20 ENCOUNTER — Ambulatory Visit: Admitting: Urology

## 2024-04-20 VITALS — BP 128/71 | HR 69

## 2024-04-20 DIAGNOSIS — R3915 Urgency of urination: Secondary | ICD-10-CM | POA: Diagnosis not present

## 2024-04-20 DIAGNOSIS — R350 Frequency of micturition: Secondary | ICD-10-CM

## 2024-04-20 DIAGNOSIS — C61 Malignant neoplasm of prostate: Secondary | ICD-10-CM

## 2024-04-20 DIAGNOSIS — N401 Enlarged prostate with lower urinary tract symptoms: Secondary | ICD-10-CM | POA: Diagnosis not present

## 2024-04-20 LAB — URINALYSIS, ROUTINE W REFLEX MICROSCOPIC
Bilirubin, UA: NEGATIVE
Glucose, UA: NEGATIVE
Ketones, UA: NEGATIVE
Nitrite, UA: NEGATIVE
Protein,UA: NEGATIVE
RBC, UA: NEGATIVE
Specific Gravity, UA: 1.02 (ref 1.005–1.030)
Urobilinogen, Ur: 0.2 mg/dL (ref 0.2–1.0)
pH, UA: 6 (ref 5.0–7.5)

## 2024-04-20 LAB — MICROSCOPIC EXAMINATION: Bacteria, UA: NONE SEEN

## 2024-04-20 MED ORDER — SILODOSIN 8 MG PO CAPS: 8.0000 mg | ORAL_CAPSULE | Freq: Every day | ORAL | 11 refills | Status: AC

## 2024-04-20 NOTE — Progress Notes (Signed)
 "  04/20/2024 10:16 AM   Angel Peters 1945/12/27 [id number]  Referring provider: Melvenia Manus BRAVO, MD [address] [place],  [place] [address]  Followup prostate cancer and BPH   HPI: Angel Peters is a 78yo here for followup for prostate cancer and BPH. PSA increased to 6.0 from 5.8. He had a negative MRI of prostate in 08/2023. IPSS 17 QOL 2 on uroxatral  10mg  at bedtime. Nocturia 3x. He has occasional urinary urgency and needing to double void.    PMH: Past Medical History:  Diagnosis Date   A-fib (HCC)    Moderate Angel  2D Echo showed EF of 45-50% on 05/08/2009   Arrhythmia    High cholesterol    Hypertension    Prostate cancer [facility])     Surgical History: Past Surgical History:  Procedure Laterality Date   CATARACT EXTRACTION Bilateral 2023   HERNIA REPAIR      Home Medications:  Allergies as of 04/20/2024   No Known Allergies      Medication List        Accurate as of April 20, 2024 10:16 AM. If you have any questions, ask your nurse or doctor.          alfuzosin  10 MG 24 hr tablet Commonly known as: UROXATRAL  TAKE 1 TABLET AT BEDTIME   amLODipine  5 MG tablet Commonly known as: NORVASC  TAKE 1 TABLET EVERY DAY   apixaban  5 MG Tabs tablet Commonly known as: Eliquis  Take 1 tablet by mouth twice daily, need MD appointment for further refills   Coenzyme Q10 100 MG capsule Take 100 mg by mouth daily.   metoprolol  succinate 100 MG 24 hr tablet Commonly known as: TOPROL -XL TAKE 1 TABLET TWICE DAILY TAKE WITH OR IMMEDIATELY FOLLOWING A MEAL.   multivitamin tablet Take 1 tablet by mouth daily.   olmesartan  20 MG tablet Commonly known as: BENICAR  TAKE 1 TABLET EVERY DAY   rosuvastatin  20 MG tablet Commonly known as: CRESTOR  TAKE 1 TABLET EVERY DAY   Turmeric 500 MG Tabs Take by mouth. Daily        Allergies: Allergies[1]  Family History: Family History  Problem Relation Age of Onset   Arthritis Mother    Miscarriages /  Stillbirths Mother    Heart disease Father        died at age 76   Early death Father    Heart disease Other    Heart disease Paternal Grandfather    Arthritis Paternal Grandmother    Cancer Paternal Grandmother     Social History:  reports that he has never smoked. He does not have any smokeless tobacco history on file. He reports current alcohol use of about 14.0 standard drinks of alcohol per week. He reports that he does not use drugs.  ROS: All other review of systems were reviewed and are negative except what is noted above in HPI  Physical Exam: BP 128/71   Pulse 69   Constitutional:  Alert and oriented, No acute distress. HEENT: [place] AT, moist mucus membranes.  Trachea midline, no masses. Cardiovascular: No clubbing, cyanosis, or edema. Respiratory: Normal respiratory effort, no increased work of breathing. GI: Abdomen is soft, nontender, nondistended, no abdominal masses GU: No CVA tenderness.  Lymph: No cervical or inguinal lymphadenopathy. Skin: No rashes, bruises or suspicious lesions. Neurologic: Grossly intact, no focal deficits, moving all 4 extremities. Psychiatric: Normal mood and affect.  Laboratory Data: Lab Results  Component Value Date   WBC 6.7 03/02/2024  HGB 17.1 03/02/2024   HCT 51.7 (H) 03/02/2024   MCV 94 03/02/2024   PLT 146 (L) 03/02/2024    Lab Results  Component Value Date   CREATININE 1.29 (H) 03/02/2024    No results found for: PSA  No results found for: TESTOSTERONE  Lab Results  Component Value Date   HGBA1C 5.7 (H) 03/02/2024    Urinalysis    Component Value Date/Time   APPEARANCEUR Clear 08/21/2023 0908   GLUCOSEU Negative 08/21/2023 0908   BILIRUBINUR Negative 08/21/2023 0908   PROTEINUR Trace 08/21/2023 0908   NITRITE Negative 08/21/2023 0908   LEUKOCYTESUR Trace (A) 08/21/2023 0908    Lab Results  Component Value Date   LABMICR See below: 08/21/2023   WBCUA 0-5 08/21/2023   LABEPIT 0-10 08/21/2023    BACTERIA None seen 08/21/2023    Pertinent Imaging:  No results found for this or any previous visit.  No results found for this or any previous visit.  No results found for this or any previous visit.  No results found for this or any previous visit.  No results found for this or any previous visit.  No results found for this or any previous visit.  No results found for this or any previous visit.  No results found for this or any previous visit.   Assessment & Plan:    1. Prostate cancer (HCC) (Primary) 6 months with PSA - Urinalysis, Routine w reflex microscopic  2. Benign prostatic hyperplasia with urinary frequency We will trial rapaflo  8mg  daily  3. Urinary urgency Rapaflo  8mg  daily   No follow-ups on file.  Belvie Clara, MD  [facility] [place]       [1] No Known Allergies  "

## 2024-04-25 ENCOUNTER — Encounter: Payer: Self-pay | Admitting: Urology

## 2024-04-25 NOTE — Patient Instructions (Signed)

## 2024-04-28 ENCOUNTER — Ambulatory Visit: Attending: Cardiology | Admitting: Cardiology

## 2024-04-28 ENCOUNTER — Encounter: Payer: Self-pay | Admitting: Cardiology

## 2024-04-28 VITALS — BP 138/82 | HR 60 | Ht 69.0 in | Wt 183.0 lb

## 2024-04-28 DIAGNOSIS — D6869 Other thrombophilia: Secondary | ICD-10-CM

## 2024-04-28 DIAGNOSIS — I10 Essential (primary) hypertension: Secondary | ICD-10-CM

## 2024-04-28 DIAGNOSIS — E782 Mixed hyperlipidemia: Secondary | ICD-10-CM

## 2024-04-28 DIAGNOSIS — I4811 Longstanding persistent atrial fibrillation: Secondary | ICD-10-CM | POA: Diagnosis not present

## 2024-04-28 DIAGNOSIS — I340 Nonrheumatic mitral (valve) insufficiency: Secondary | ICD-10-CM | POA: Diagnosis not present

## 2024-04-28 NOTE — Progress Notes (Signed)
 "    Clinical Summary Mr. Villwock is a 79 y.o.male seen today for follow up of the following medical problems.    Previously seen by Dr Mona, last visit 08/2016  Most recently seen Dr Delaine Red in [place], [place]. Phone [phone].        1.Long standing persistent Afib - afib at least since 2013 by EKGs, has been asymptomatic and rate controlled He reports at least 30 year history   -no recent palpitations - compliant with meds - no bleeding on eliquis .      2. Hyperlipidemia 07/2022 TC 137 TG 69 HDL 57 LDL 66 02/2024 TC 161 TG 98 HDL 56 LDL 87     3. HTN - home bp's are typically 120s/70s - compliant with meds.      4. Mitral regurgitation - mild MR by 2015 echo - reports more recent echo in [place] , we are requesting again     SH: recently moved back to the area, had lived in [place]  for 9 years. Has family in this area.  Past Medical History:  Diagnosis Date   A-fib (HCC)    Moderate MR  2D Echo showed EF of 45-50% on 05/08/2009   Arrhythmia    High cholesterol    Hypertension    Prostate cancer (HCC)      Allergies[1]   Current Outpatient Medications  Medication Sig Dispense Refill   alfuzosin  (UROXATRAL ) 10 MG 24 hr tablet TAKE 1 TABLET AT BEDTIME 90 tablet 3   amLODipine  (NORVASC ) 5 MG tablet TAKE 1 TABLET EVERY DAY 90 tablet 3   apixaban  (ELIQUIS ) 5 MG TABS tablet Take 1 tablet by mouth twice daily, need MD appointment for further refills 180 tablet 0   Coenzyme Q10 100 MG capsule Take 100 mg by mouth daily.     metoprolol  succinate (TOPROL -XL) 100 MG 24 hr tablet TAKE 1 TABLET TWICE DAILY TAKE WITH OR IMMEDIATELY FOLLOWING A MEAL. 180 tablet 3   Multiple Vitamin (MULTIVITAMIN) tablet Take 1 tablet by mouth daily.     olmesartan  (BENICAR ) 20 MG tablet TAKE 1 TABLET EVERY DAY 90 tablet 3   rosuvastatin  (CRESTOR ) 20 MG tablet TAKE 1 TABLET EVERY DAY 90 tablet 3   silodosin  (RAPAFLO ) 8 MG CAPS capsule Take 1 capsule (8 mg total) by mouth at  bedtime. 30 capsule 11   Turmeric 500 MG TABS Take by mouth. Daily     No current facility-administered medications for this visit.     Past Surgical History:  Procedure Laterality Date   CATARACT EXTRACTION Bilateral 2023   HERNIA REPAIR       Allergies[2]    Family History  Problem Relation Age of Onset   Arthritis Mother    Miscarriages / Stillbirths Mother    Heart disease Father        died at age 91   Early death Father    Heart disease Other    Heart disease Paternal Grandfather    Arthritis Paternal Grandmother    Cancer Paternal Grandmother      Social History Mr. Berkheimer reports that he has never smoked. He does not have any smokeless tobacco history on file. Mr. Herzberg reports current alcohol use of about 14.0 standard drinks of alcohol per week.    Physical Examination Vitals:   04/28/24 0835 04/28/24 0855  BP: (!) 142/76 138/82  Pulse: 60   SpO2: 97%    Filed Weights   04/28/24 0835  Weight: 183 lb (83 kg)  Gen: resting comfortably, no acute distress HEENT: no scleral icterus, pupils equal round and reactive, no palptable cervical adenopathy,  CV: RRR, no m/rg, no jvd Resp: Clear to auscultation bilaterally GI: abdomen is soft, non-tender, non-distended, normal bowel sounds, no hepatosplenomegaly MSK: extremities are warm, no edema.  Skin: warm, no rash Neuro:  no focal deficits Psych: appropriate affect   Diagnostic Studies  08/2013 echo Study Conclusions   - Left ventricle: The cavity size was normal. Systolic function was    normal. The estimated ejection fraction was in the range of 50%    to 55%. Wall motion was normal; there were no regional wall    motion abnormalities. The study was not technically sufficient to    allow evaluation of LV diastolic dysfunction due to atrial    fibrillation. There was no evidence of elevated ventricular    filling pressure by Doppler parameters. Mild concentric and    moderate, focal basal  septal hypertrophy.  - Aortic valve: There was trivial regurgitation.  - Mitral valve: Mildly thickened leaflets . There was mild    regurgitation.  - Left atrium: The atrium was severely dilated.  - Right atrium: The atrium was mildly to moderately dilated.  - Tricuspid valve: There was mild-moderate regurgitation.  - Pulmonary arteries: PA peak pressure: 31 mm Hg (S)        Assessment and Plan   1.Long standing persistent afib/acquired thrombophilia - no symptoms, has done well for long time with just rate control - no symptoms, continue current meds - EKG today shows rate controlled afib   2. HTN - bp elevated today, home numbers at goal. He will update us  1 week on additional home bp's, continue current meds at this time. Can titrate norvasc  if needed   3. Hyperlipidemia - at goal, continue current meds   4. Mitral regurgitation - mild by echo 2015, no significant murmur on exam - request most recent study from [place]  cardiologist     Dorn PHEBE Ross, M.D.     [1] No Known Allergies [2] No Known Allergies  "

## 2024-04-28 NOTE — Patient Instructions (Signed)
 Medication Instructions:  Your physician recommends that you continue on your current medications as directed. Please refer to the Current Medication list given to you today.  *If you need a refill on your cardiac medications before your next appointment, please call your pharmacy*  Lab Work: None If you have labs (blood work) drawn today and your tests are completely normal, you will receive your results only by: MyChart Message (if you have MyChart) OR A paper copy in the mail If you have any lab test that is abnormal or we need to change your treatment, we will call you to review the results.  Testing/Procedures: None  Follow-Up: At [facility], you and your health needs are our priority.  As part of our continuing mission to provide you with exceptional heart care, our providers are all part of one team.  This team includes your primary Cardiologist (physician) and Advanced Practice Providers or APPs (Physician Assistants and Nurse Practitioners) who all work together to provide you with the care you need, when you need it.  Your next appointment:   1 year(s)  Provider:   You may see Dorn Ross, MD or one of the following Advanced Practice Providers on your designated Care Team:   Laymon Qua, PA-C  [place], [place] Olivia [place], [place]     We recommend signing up for the patient portal called MyChart.  Sign up information is provided on this After Visit Summary.  MyChart is used to connect with patients for Virtual Visits (Telemedicine).  Patients are able to view lab/test results, encounter notes, upcoming appointments, etc.  Non-urgent messages can be sent to your provider as well.   To learn more about what you can do with MyChart, go to [email or web address].   Other Instructions Your physician has requested that you regularly monitor and record your blood pressure readings at home. Please use the same machine at the same time of day to check your  readings and record them to bring to our office in 1 week.

## 2024-05-09 ENCOUNTER — Encounter: Payer: Self-pay | Admitting: Cardiology

## 2024-05-10 ENCOUNTER — Ambulatory Visit: Payer: Self-pay | Admitting: Cardiology

## 2024-10-10 ENCOUNTER — Ambulatory Visit

## 2024-10-18 ENCOUNTER — Other Ambulatory Visit

## 2024-10-28 ENCOUNTER — Ambulatory Visit: Admitting: Urology
